# Patient Record
Sex: Female | Born: 1955 | Race: White | Hispanic: Yes | Marital: Single | Smoking: Current every day smoker
Health system: Southern US, Community
[De-identification: ages and names within clinical notes are randomized; demographics above are authoritative.]

## PROBLEM LIST (undated history)

## (undated) DIAGNOSIS — D573 Sickle-cell trait: Secondary | ICD-10-CM

## (undated) DIAGNOSIS — I10 Essential (primary) hypertension: Secondary | ICD-10-CM

## (undated) DIAGNOSIS — M1990 Unspecified osteoarthritis, unspecified site: Secondary | ICD-10-CM

## (undated) DIAGNOSIS — J449 Chronic obstructive pulmonary disease, unspecified: Secondary | ICD-10-CM

## (undated) DIAGNOSIS — F419 Anxiety disorder, unspecified: Secondary | ICD-10-CM

## (undated) DIAGNOSIS — M549 Dorsalgia, unspecified: Secondary | ICD-10-CM

## (undated) DIAGNOSIS — M810 Age-related osteoporosis without current pathological fracture: Secondary | ICD-10-CM

## (undated) DIAGNOSIS — R0600 Dyspnea, unspecified: Secondary | ICD-10-CM

## (undated) DIAGNOSIS — M797 Fibromyalgia: Secondary | ICD-10-CM

## (undated) DIAGNOSIS — K219 Gastro-esophageal reflux disease without esophagitis: Secondary | ICD-10-CM

## (undated) DIAGNOSIS — R002 Palpitations: Secondary | ICD-10-CM

## (undated) DIAGNOSIS — I499 Cardiac arrhythmia, unspecified: Secondary | ICD-10-CM

## (undated) DIAGNOSIS — K5790 Diverticulosis of intestine, part unspecified, without perforation or abscess without bleeding: Secondary | ICD-10-CM

## (undated) DIAGNOSIS — R7303 Prediabetes: Secondary | ICD-10-CM

## (undated) HISTORY — PX: EYE SURGERY: SHX253

---

## 2001-09-03 ENCOUNTER — Other Ambulatory Visit: Admission: RE | Admit: 2001-09-03 | Discharge: 2001-09-03 | Payer: Self-pay | Admitting: Obstetrics and Gynecology

## 2002-10-14 ENCOUNTER — Other Ambulatory Visit: Admission: RE | Admit: 2002-10-14 | Discharge: 2002-10-14 | Payer: Self-pay | Admitting: Obstetrics and Gynecology

## 2005-06-22 ENCOUNTER — Ambulatory Visit: Payer: Self-pay | Admitting: Occupational Therapy

## 2009-11-27 HISTORY — PX: ROTATOR CUFF REPAIR: SHX139

## 2015-06-01 ENCOUNTER — Other Ambulatory Visit (HOSPITAL_COMMUNITY): Payer: Self-pay | Admitting: Family Medicine

## 2015-06-01 DIAGNOSIS — Z1231 Encounter for screening mammogram for malignant neoplasm of breast: Secondary | ICD-10-CM

## 2015-06-01 DIAGNOSIS — M810 Age-related osteoporosis without current pathological fracture: Secondary | ICD-10-CM

## 2015-06-14 ENCOUNTER — Ambulatory Visit (HOSPITAL_COMMUNITY)
Admission: RE | Admit: 2015-06-14 | Discharge: 2015-06-14 | Disposition: A | Discharge status: Home / Self Care | Payer: Medicaid Other | Source: Ambulatory Visit | Attending: Family Medicine | Admitting: Family Medicine

## 2015-06-14 DIAGNOSIS — M818 Other osteoporosis without current pathological fracture: Secondary | ICD-10-CM | POA: Insufficient documentation

## 2015-06-14 DIAGNOSIS — Z1231 Encounter for screening mammogram for malignant neoplasm of breast: Secondary | ICD-10-CM | POA: Diagnosis not present

## 2015-06-14 DIAGNOSIS — M810 Age-related osteoporosis without current pathological fracture: Secondary | ICD-10-CM

## 2015-06-18 ENCOUNTER — Other Ambulatory Visit: Payer: Self-pay | Admitting: Family Medicine

## 2015-06-18 DIAGNOSIS — R928 Other abnormal and inconclusive findings on diagnostic imaging of breast: Secondary | ICD-10-CM

## 2015-07-13 ENCOUNTER — Other Ambulatory Visit (HOSPITAL_COMMUNITY): Payer: Self-pay | Admitting: Family Medicine

## 2015-07-13 DIAGNOSIS — R928 Other abnormal and inconclusive findings on diagnostic imaging of breast: Secondary | ICD-10-CM

## 2015-07-13 DIAGNOSIS — N6320 Unspecified lump in the left breast, unspecified quadrant: Secondary | ICD-10-CM

## 2015-07-27 ENCOUNTER — Other Ambulatory Visit (HOSPITAL_COMMUNITY): Payer: Self-pay | Admitting: Family Medicine

## 2015-07-27 ENCOUNTER — Ambulatory Visit (HOSPITAL_COMMUNITY)
Admission: RE | Admit: 2015-07-27 | Discharge: 2015-07-27 | Disposition: A | Discharge status: Home / Self Care | Payer: Medicaid Other | Source: Ambulatory Visit | Attending: Family Medicine | Admitting: Family Medicine

## 2015-07-27 DIAGNOSIS — N6002 Solitary cyst of left breast: Secondary | ICD-10-CM | POA: Diagnosis not present

## 2015-07-27 DIAGNOSIS — N6320 Unspecified lump in the left breast, unspecified quadrant: Secondary | ICD-10-CM

## 2015-07-27 DIAGNOSIS — R928 Other abnormal and inconclusive findings on diagnostic imaging of breast: Secondary | ICD-10-CM

## 2018-01-21 ENCOUNTER — Other Ambulatory Visit: Payer: Self-pay | Admitting: Sports Medicine

## 2018-01-21 DIAGNOSIS — M47812 Spondylosis without myelopathy or radiculopathy, cervical region: Secondary | ICD-10-CM

## 2018-01-21 DIAGNOSIS — M5136 Other intervertebral disc degeneration, lumbar region: Secondary | ICD-10-CM

## 2018-01-30 ENCOUNTER — Ambulatory Visit
Admission: RE | Admit: 2018-01-30 | Discharge: 2018-01-30 | Disposition: A | Discharge status: Home / Self Care | Payer: Medicaid Other | Source: Ambulatory Visit | Attending: Sports Medicine | Admitting: Sports Medicine

## 2018-01-30 DIAGNOSIS — M48061 Spinal stenosis, lumbar region without neurogenic claudication: Secondary | ICD-10-CM | POA: Diagnosis not present

## 2018-01-30 DIAGNOSIS — M4802 Spinal stenosis, cervical region: Secondary | ICD-10-CM | POA: Insufficient documentation

## 2018-01-30 DIAGNOSIS — M47812 Spondylosis without myelopathy or radiculopathy, cervical region: Secondary | ICD-10-CM | POA: Insufficient documentation

## 2018-01-30 DIAGNOSIS — M5136 Other intervertebral disc degeneration, lumbar region: Secondary | ICD-10-CM | POA: Diagnosis present

## 2018-02-04 ENCOUNTER — Other Ambulatory Visit: Payer: Self-pay | Admitting: Gastroenterology

## 2018-02-04 DIAGNOSIS — R1310 Dysphagia, unspecified: Secondary | ICD-10-CM

## 2018-02-04 DIAGNOSIS — R1011 Right upper quadrant pain: Secondary | ICD-10-CM

## 2018-02-11 ENCOUNTER — Ambulatory Visit
Admission: RE | Admit: 2018-02-11 | Discharge: 2018-02-11 | Disposition: A | Discharge status: Home / Self Care | Payer: Medicaid Other | Source: Ambulatory Visit | Attending: Gastroenterology | Admitting: Gastroenterology

## 2018-02-11 ENCOUNTER — Other Ambulatory Visit: Payer: Medicaid Other

## 2018-02-11 DIAGNOSIS — R1011 Right upper quadrant pain: Secondary | ICD-10-CM

## 2018-02-11 DIAGNOSIS — R1310 Dysphagia, unspecified: Secondary | ICD-10-CM

## 2018-02-11 DIAGNOSIS — K449 Diaphragmatic hernia without obstruction or gangrene: Secondary | ICD-10-CM | POA: Diagnosis not present

## 2018-04-05 ENCOUNTER — Encounter: Payer: Self-pay | Admitting: *Deleted

## 2018-04-08 ENCOUNTER — Encounter: Payer: Self-pay | Admitting: Anesthesiology

## 2018-04-08 ENCOUNTER — Encounter
Admission: RE | Disposition: A | Discharge status: Home / Self Care | Payer: Self-pay | Source: Ambulatory Visit | Attending: Gastroenterology

## 2018-04-08 ENCOUNTER — Ambulatory Visit: Payer: Medicaid Other | Admitting: Certified Registered Nurse Anesthetist

## 2018-04-08 ENCOUNTER — Ambulatory Visit
Admission: RE | Admit: 2018-04-08 | Discharge: 2018-04-08 | Disposition: A | Discharge status: Home / Self Care | Payer: Medicaid Other | Source: Ambulatory Visit | Attending: Gastroenterology | Admitting: Gastroenterology

## 2018-04-08 DIAGNOSIS — Z87891 Personal history of nicotine dependence: Secondary | ICD-10-CM | POA: Diagnosis not present

## 2018-04-08 DIAGNOSIS — D125 Benign neoplasm of sigmoid colon: Secondary | ICD-10-CM | POA: Insufficient documentation

## 2018-04-08 DIAGNOSIS — K5730 Diverticulosis of large intestine without perforation or abscess without bleeding: Secondary | ICD-10-CM | POA: Insufficient documentation

## 2018-04-08 DIAGNOSIS — K635 Polyp of colon: Secondary | ICD-10-CM | POA: Insufficient documentation

## 2018-04-08 DIAGNOSIS — I10 Essential (primary) hypertension: Secondary | ICD-10-CM | POA: Diagnosis not present

## 2018-04-08 DIAGNOSIS — R1310 Dysphagia, unspecified: Secondary | ICD-10-CM | POA: Diagnosis present

## 2018-04-08 DIAGNOSIS — K210 Gastro-esophageal reflux disease with esophagitis: Secondary | ICD-10-CM | POA: Diagnosis not present

## 2018-04-08 DIAGNOSIS — Q399 Congenital malformation of esophagus, unspecified: Secondary | ICD-10-CM | POA: Insufficient documentation

## 2018-04-08 DIAGNOSIS — M1990 Unspecified osteoarthritis, unspecified site: Secondary | ICD-10-CM | POA: Diagnosis not present

## 2018-04-08 DIAGNOSIS — Z8619 Personal history of other infectious and parasitic diseases: Secondary | ICD-10-CM | POA: Insufficient documentation

## 2018-04-08 DIAGNOSIS — R194 Change in bowel habit: Secondary | ICD-10-CM | POA: Insufficient documentation

## 2018-04-08 DIAGNOSIS — D573 Sickle-cell trait: Secondary | ICD-10-CM | POA: Insufficient documentation

## 2018-04-08 DIAGNOSIS — Z79899 Other long term (current) drug therapy: Secondary | ICD-10-CM | POA: Insufficient documentation

## 2018-04-08 DIAGNOSIS — K2950 Unspecified chronic gastritis without bleeding: Secondary | ICD-10-CM | POA: Diagnosis not present

## 2018-04-08 DIAGNOSIS — M810 Age-related osteoporosis without current pathological fracture: Secondary | ICD-10-CM | POA: Insufficient documentation

## 2018-04-08 DIAGNOSIS — K449 Diaphragmatic hernia without obstruction or gangrene: Secondary | ICD-10-CM | POA: Insufficient documentation

## 2018-04-08 DIAGNOSIS — M797 Fibromyalgia: Secondary | ICD-10-CM | POA: Insufficient documentation

## 2018-04-08 HISTORY — DX: Essential (primary) hypertension: I10

## 2018-04-08 HISTORY — PX: ESOPHAGOGASTRODUODENOSCOPY (EGD) WITH PROPOFOL: SHX5813

## 2018-04-08 HISTORY — DX: Age-related osteoporosis without current pathological fracture: M81.0

## 2018-04-08 HISTORY — PX: COLONOSCOPY WITH PROPOFOL: SHX5780

## 2018-04-08 HISTORY — DX: Dorsalgia, unspecified: M54.9

## 2018-04-08 HISTORY — DX: Cardiac arrhythmia, unspecified: I49.9

## 2018-04-08 HISTORY — DX: Fibromyalgia: M79.7

## 2018-04-08 HISTORY — DX: Gastro-esophageal reflux disease without esophagitis: K21.9

## 2018-04-08 HISTORY — DX: Sickle-cell trait: D57.3

## 2018-04-08 HISTORY — DX: Unspecified osteoarthritis, unspecified site: M19.90

## 2018-04-08 SURGERY — COLONOSCOPY WITH PROPOFOL
Anesthesia: General

## 2018-04-08 MED ORDER — SODIUM CHLORIDE 0.9 % IV SOLN
INTRAVENOUS | Status: DC
  Administered 2018-04-08: 1000 mL via INTRAVENOUS

## 2018-04-08 MED ORDER — SODIUM CHLORIDE 0.9 % IV SOLN: INTRAVENOUS | Status: DC

## 2018-04-08 MED ORDER — PROPOFOL 500 MG/50ML IV EMUL
INTRAVENOUS | Status: DC | PRN
  Administered 2018-04-08: 150 ug/kg/min via INTRAVENOUS

## 2018-04-08 MED ORDER — LIDOCAINE HCL (CARDIAC) PF 100 MG/5ML IV SOSY
PREFILLED_SYRINGE | INTRAVENOUS | Status: DC | PRN
  Administered 2018-04-08: 100 mg via INTRAVENOUS

## 2018-04-08 MED ORDER — PROPOFOL 10 MG/ML IV BOLUS
INTRAVENOUS | Status: AC
  Filled 2018-04-08: qty 20

## 2018-04-08 MED ORDER — PROPOFOL 500 MG/50ML IV EMUL
INTRAVENOUS | Status: AC
  Filled 2018-04-08: qty 50

## 2018-04-08 MED ORDER — PROPOFOL 10 MG/ML IV BOLUS
INTRAVENOUS | Status: DC | PRN
  Administered 2018-04-08: 60 mg via INTRAVENOUS
  Administered 2018-04-08: 40 mg via INTRAVENOUS

## 2018-04-08 NOTE — Anesthesia Procedure Notes (Signed)
Performed by: Demetrius Charity, CRNA Pre-anesthesia Checklist: Patient identified, Emergency Drugs available, Suction available, Patient being monitored and Timeout performed Patient Re-evaluated:Patient Re-evaluated prior to induction Oxygen Delivery Method: Nasal cannula

## 2018-04-08 NOTE — Anesthesia Preprocedure Evaluation (Signed)
Anesthesia Evaluation  Patient identified by MRN, date of birth, ID band Patient awake    Reviewed: Allergy & Precautions, H&P , NPO status , Patient's Chart, lab work & pertinent test results, reviewed documented beta blocker date and time   History of Anesthesia Complications Negative for: history of anesthetic complications  Airway Mallampati: III  TM Distance: >3 FB Neck ROM: full    Dental  (+) Dental Advidsory Given, Poor Dentition   Pulmonary neg shortness of breath, neg sleep apnea, COPD, neg recent URI, former smoker,           Cardiovascular Exercise Tolerance: Good hypertension, (-) angina(-) CAD, (-) Past MI, (-) Cardiac Stents and (-) CABG + dysrhythmias (-) Valvular Problems/Murmurs     Neuro/Psych neg Seizures  Neuromuscular disease (fibromyalgia) negative psych ROS   GI/Hepatic Neg liver ROS, GERD  ,  Endo/Other  negative endocrine ROS  Renal/GU negative Renal ROS  negative genitourinary   Musculoskeletal   Abdominal   Peds  Hematology negative hematology ROS (+) Blood dyscrasia, Sickle cell trait ,   Anesthesia Other Findings Past Medical History: No date: Arthritis No date: Back pain No date: Dysrhythmia No date: Fibromyalgia No date: GERD (gastroesophageal reflux disease) No date: Hypertension No date: Osteoporosis No date: Sickle cell trait (HCC)   Reproductive/Obstetrics negative OB ROS                             Anesthesia Physical Anesthesia Plan  ASA: III  Anesthesia Plan: General   Post-op Pain Management:    Induction: Intravenous  PONV Risk Score and Plan: 3 and Propofol infusion  Airway Management Planned: Nasal Cannula  Additional Equipment:   Intra-op Plan:   Post-operative Plan:   Informed Consent: I have reviewed the patients History and Physical, chart, labs and discussed the procedure including the risks, benefits and alternatives for  the proposed anesthesia with the patient or authorized representative who has indicated his/her understanding and acceptance.   Dental Advisory Given  Plan Discussed with: Anesthesiologist, CRNA and Surgeon  Anesthesia Plan Comments:         Anesthesia Quick Evaluation

## 2018-04-08 NOTE — Op Note (Addendum)
[facility] Gastroenterology Patient Name: Colleen Murillo Procedure Date: 04/08/2018 9:39 AM MRN: [id number] Account #: [id number] Date of Birth: 10-10-1956 Admit Type: Outpatient Age: 63 Room: [facility] ENDO ROOM 1 Gender: Female Note Status: Finalized Procedure:            Upper GI endoscopy Indications:          Dysphagia Providers:            Lollie Sails, MD Referring MD:         Audria Nine, MD (Referring MD) Medicines:            Monitored Anesthesia Care Complications:        No immediate complications. Procedure:            Pre-Anesthesia Assessment:                       - ASA Grade Assessment: III - A patient with severe                        systemic disease.                       After obtaining informed consent, the endoscope was                        passed under direct vision. Throughout the procedure,                        the patient's blood pressure, pulse, and oxygen                        saturations were monitored continuously. The Endoscope                        was introduced through the mouth, and advanced to the                        third part of duodenum. The upper GI endoscopy was                        accomplished without difficulty. The patient tolerated                        the procedure well. Findings:      The lower third of the esophagus was mildly tortuous.      A small hiatal hernia was present.      LA Grade A (one or more mucosal breaks less than 5 mm, not extending       between tops of 2 mucosal folds) esophagitis with no bleeding was found.       Biopsies were taken with a cold forceps for histology.      The exam of the esophagus was otherwise normal.      There is no evidence of stenosis or stricture.      Patchy minimal inflammation characterized by erosions and erythema was       found in the prepyloric region of the stomach. Biopsies were taken with       a cold forceps for histology. Biopsies were  taken with a cold forceps       for Helicobacter pylori testing.      The  cardia and gastric fundus were normal on retroflexion.      The examined duodenum was normal. Impression:           - Tortuous esophagus.                       - Small hiatal hernia.                       - LA Grade A erosive esophagitis. Biopsied.                       - Gastritis. Biopsied.                       - Normal examined duodenum. Recommendation:       - Discharge patient to home.                       - Use Protonix (pantoprazole) 40 mg PO daily daily.                       - Perform a hepatobiliary scan with [facility] at appointment                        to be scheduled.                       - Return to GI clinic in 4 weeks. Procedure Code(s):    --- Professional ---                       [phone], Esophagogastroduodenoscopy, flexible, transoral;                        with biopsy, single or multiple Diagnosis Code(s):    --- Professional ---                       Q39.9, Congenital malformation of esophagus, unspecified                       K44.9, Diaphragmatic hernia without obstruction or                        gangrene                       K20.8, Other esophagitis                       K29.70, Gastritis, unspecified, without bleeding                       R13.10, Dysphagia, unspecified CPT copyright 2017 American Medical Association. All rights reserved. The codes documented in this report are preliminary and upon coder review may  be revised to meet current compliance requirements. Lollie Sails, MD 04/08/2018 10:09:04 AM This report has been signed electronically. Number of Addenda: 0 Note Initiated On: 04/08/2018 9:39 AM      [facility]

## 2018-04-08 NOTE — Transfer of Care (Signed)
Immediate Anesthesia Transfer of Care Note  Patient: Colleen Murillo  Procedure(s) Performed: COLONOSCOPY WITH PROPOFOL (N/A ) ESOPHAGOGASTRODUODENOSCOPY (EGD) WITH PROPOFOL (N/A )  Patient Location: PACU  Anesthesia Type:General  Level of Consciousness: sedated  Airway & Oxygen Therapy: Patient Spontanous Breathing and Patient connected to nasal cannula oxygen  Post-op Assessment: Report given to RN and Post -op Vital signs reviewed and stable  Post vital signs: Reviewed and stable  Last Vitals:  Vitals Value Taken Time  BP    Temp    Pulse    Resp    SpO2      Last Pain:  Vitals:   04/08/18 0856  TempSrc: Tympanic  PainSc: 0-No pain         Complications: No apparent anesthesia complications

## 2018-04-08 NOTE — Anesthesia Post-op Follow-up Note (Signed)
Anesthesia QCDR form completed.        

## 2018-04-08 NOTE — Op Note (Addendum)
[facility] Gastroenterology Patient Name: Colleen Murillo Procedure Date: 04/08/2018 9:40 AM MRN: [id number] Account #: [id number] Date of Birth: 06/18/1956 Admit Type: Outpatient Age: 62 Room: [facility] ENDO ROOM 1 Gender: Female Note Status: Finalized Procedure:            Colonoscopy Indications:          Change in bowel habits Providers:            Lollie Sails, MD Referring MD:         Deeann Dowse. Laurance Flatten (Referring MD) Medicines:            Monitored Anesthesia Care Complications:        No immediate complications. Procedure:            Pre-Anesthesia Assessment:                       - ASA Grade Assessment: III - A patient with severe                        systemic disease.                       After obtaining informed consent, the colonoscope was                        passed under direct vision. Throughout the procedure,                        the patient's blood pressure, pulse, and oxygen                        saturations were monitored continuously. The                        Colonoscope was introduced through the anus and                        advanced to the the cecum, identified by appendiceal                        orifice and ileocecal valve. The colonoscopy was                        performed with moderate difficulty. Successful                        completion of the procedure was aided by changing the                        patient to a prone position and using manual pressure.                        The patient tolerated the procedure well. The quality                        of the bowel preparation was good. Findings:      A 5 mm polyp was found in the proximal descending colon. The polyp was       sessile. The polyp was removed with a cold snare. Resection and       retrieval were complete.  A 5 mm polyp was found in the proximal sigmoid colon. The polyp was       sessile. The polyp was removed with a cold snare. Resection and     retrieval were complete.      A 5 mm polyp was found in the distal sigmoid colon. The polyp was       sessile. The polyp was removed with a cold snare. Resection and       retrieval were complete.      Biopsies for histology were taken with a cold forceps from the right       colon and left colon for evaluation of microscopic colitis.      A few small-mouthed diverticula were found in the sigmoid colon and       distal descending colon. Impression:           - One 5 mm polyp in the proximal descending colon,                        removed with a cold snare. Resected and retrieved.                       - One 5 mm polyp in the proximal sigmoid colon, removed                        with a cold snare. Resected and retrieved.                       - One 5 mm polyp in the distal sigmoid colon, removed                        with a cold snare. Resected and retrieved.                       - Diverticulosis in the sigmoid colon and in the distal                        descending colon.                       - Biopsies were taken with a cold forceps from the                        right colon and left colon for evaluation of                        microscopic colitis. Recommendation:       - Discharge patient to home.                       - Use Citrucel one tablespoon PO daily daily. Procedure Code(s):    --- Professional ---                       [phone], Colonoscopy, flexible; with removal of tumor(s),                        polyp(s), or other lesion(s) by snare technique                       16010, 41, Colonoscopy, flexible; with biopsy,  single                        or multiple Diagnosis Code(s):    --- Professional ---                       D12.4, Benign neoplasm of descending colon                       D12.5, Benign neoplasm of sigmoid colon                       R19.4, Change in bowel habit                       K57.30, Diverticulosis of large intestine without                         perforation or abscess without bleeding CPT copyright 2017 American Medical Association. All rights reserved. The codes documented in this report are preliminary and upon coder review may  be revised to meet current compliance requirements. Lollie Sails, MD 04/08/2018 10:48:02 AM This report has been signed electronically. Number of Addenda: 0 Note Initiated On: 04/08/2018 9:40 AM Scope Withdrawal Time: 0 hours 20 minutes 48 seconds  Total Procedure Duration: 0 hours 32 minutes 19 seconds       [facility]

## 2018-04-08 NOTE — H&P (Signed)
Outpatient short stay form Pre-procedure 04/08/2018 9:48 AM Lollie Sails MD  Primary Physician: Alonza Smoker, NP  Reason for visit: EGD and colonoscopy  History of present illness: Patient is a 62 year old female presenting today as above.  She has had a change of bowel habits feeling of incomplete defecation.  She denies any actual diarrhea.  She does have some occasional dysphagia mostly a poor bad taste in her mouth and dryness as well as hoarseness.  Has been treated for Helicobacter pylori in the past.  Not regurgitate foods.  She had a barium swallow done on 02/11/2018 with finding of a small nonreducible hiatal hernia with a possible short segment narrowing above the hiatal hernia that delayed transit of the barium tablet no evidence of esophagitis or other lesion.  She tolerated her prep well.  She takes no aspirin or blood thinning agent.    Current Facility-Administered Medications:  .  0.9 %  sodium chloride infusion, , Intravenous, Continuous, Manya Silvas, MD, Last Rate: 20 mL/hr at 04/08/18 0910, 1,000 mL at 04/08/18 0910 .  0.9 %  sodium chloride infusion, , Intravenous, Continuous, Lollie Sails, MD  Medications Prior to Admission  Medication Sig Dispense Refill Last Dose  . acetaminophen (TYLENOL) 500 MG tablet Take 500 mg by mouth every 6 (six) hours as needed.   Past Week at Unknown time  . amLODipine (NORVASC) 5 MG tablet Take 5 mg by mouth daily.   04/08/2018 at 0600  . calcium carbonate (OSCAL) 1500 (600 Ca) MG TABS tablet Take 600 mg by mouth daily with breakfast.   Past Week at Unknown time  . cholecalciferol (VITAMIN D) 400 units TABS tablet Take 2,000 Units by mouth daily.   Past Week at Unknown time  . DULoxetine (CYMBALTA) 30 MG capsule Take 30 mg by mouth daily.   Past Week at Unknown time  . Melatonin 10 MG TABS Take 10 mg by mouth daily.   Past Week at Unknown time  . metoprolol tartrate (LOPRESSOR) 25 MG tablet Take 25 mg by mouth 2 (two) times  daily.   04/08/2018 at 0600  . pantoprazole (PROTONIX) 40 MG tablet Take 40 mg by mouth 2 (two) times daily.   Past Week at Unknown time  . ranitidine (ZANTAC) 150 MG tablet Take 150 mg by mouth daily.   Past Week at Unknown time  . Tiotropium Bromide Monohydrate 2.5 MCG/ACT AERS Inhale 2.5 mcg into the lungs daily.   Past Week at Unknown time     No Known Allergies   Past Medical History:  Diagnosis Date  . Arthritis   . Back pain   . Dysrhythmia   . Fibromyalgia   . GERD (gastroesophageal reflux disease)   . Hypertension   . Osteoporosis   . Sickle cell trait ([place])     Review of systems:      Physical Exam    Heart and lungs: Regular rate and rhythm without rub or gallop, lungs are bilaterally clear.    HEENT: Normocephalic atraumatic eyes are anicteric    Other:    Pertinant exam for procedure: Soft nontender nondistended bowel sounds positive normoactive    Planned proceedures: EGD and colonoscopy with indicated procedures. I have discussed the risks benefits and complications of procedures to include not limited to bleeding, infection, perforation and the risk of sedation and the patient wishes to proceed.    Lollie Sails, MD Gastroenterology 04/08/2018  9:48 AM

## 2018-04-08 NOTE — Anesthesia Postprocedure Evaluation (Signed)
Anesthesia Post Note  Patient: Kristol Almanzar  Procedure(s) Performed: COLONOSCOPY WITH PROPOFOL (N/A ) ESOPHAGOGASTRODUODENOSCOPY (EGD) WITH PROPOFOL (N/A )  Patient location during evaluation: Endoscopy Anesthesia Type: General Level of consciousness: awake and alert Pain management: pain level controlled Vital Signs Assessment: post-procedure vital signs reviewed and stable Respiratory status: spontaneous breathing, nonlabored ventilation, respiratory function stable and patient connected to nasal cannula oxygen Cardiovascular status: blood pressure returned to baseline and stable Postop Assessment: no apparent nausea or vomiting Anesthetic complications: no     Last Vitals:  Vitals:   04/08/18 1050 04/08/18 1120  BP: 125/78 135/88  Pulse: 74 66  Resp: 15 17  Temp:    SpO2: 100% 100%    Last Pain:  Vitals:   04/08/18 1040  TempSrc: Tympanic  PainSc:                  Martha Clan

## 2018-04-10 ENCOUNTER — Encounter: Payer: Self-pay | Admitting: Gastroenterology

## 2018-04-10 LAB — SURGICAL PATHOLOGY

## 2018-04-29 ENCOUNTER — Encounter: Payer: Self-pay | Admitting: Gastroenterology

## 2018-05-07 ENCOUNTER — Other Ambulatory Visit: Payer: Self-pay | Admitting: Gastroenterology

## 2018-05-07 DIAGNOSIS — R140 Abdominal distension (gaseous): Secondary | ICD-10-CM

## 2018-05-07 DIAGNOSIS — K219 Gastro-esophageal reflux disease without esophagitis: Secondary | ICD-10-CM

## 2018-07-04 ENCOUNTER — Ambulatory Visit
Admission: RE | Admit: 2018-07-04 | Discharge: 2018-07-04 | Disposition: A | Discharge status: Home / Self Care | Payer: Medicaid Other | Source: Ambulatory Visit | Attending: Gastroenterology | Admitting: Gastroenterology

## 2018-07-04 DIAGNOSIS — A048 Other specified bacterial intestinal infections: Secondary | ICD-10-CM | POA: Insufficient documentation

## 2018-07-04 DIAGNOSIS — R1013 Epigastric pain: Secondary | ICD-10-CM | POA: Diagnosis present

## 2018-07-04 DIAGNOSIS — R140 Abdominal distension (gaseous): Secondary | ICD-10-CM | POA: Insufficient documentation

## 2018-07-04 DIAGNOSIS — K219 Gastro-esophageal reflux disease without esophagitis: Secondary | ICD-10-CM | POA: Insufficient documentation

## 2018-07-04 MED ORDER — TECHNETIUM TC 99M MEBROFENIN IV KIT
5.0000 | PACK | Freq: Once | INTRAVENOUS | Status: AC | PRN
  Administered 2018-07-04: 4.87 via INTRAVENOUS

## 2018-07-18 ENCOUNTER — Other Ambulatory Visit: Payer: Self-pay | Admitting: Gastroenterology

## 2018-07-18 DIAGNOSIS — R140 Abdominal distension (gaseous): Secondary | ICD-10-CM

## 2018-08-07 ENCOUNTER — Ambulatory Visit
Admission: RE | Admit: 2018-08-07 | Discharge: 2018-08-07 | Disposition: A | Discharge status: Home / Self Care | Payer: Medicaid Other | Source: Ambulatory Visit | Attending: Gastroenterology | Admitting: Gastroenterology

## 2018-08-07 DIAGNOSIS — K5730 Diverticulosis of large intestine without perforation or abscess without bleeding: Secondary | ICD-10-CM | POA: Insufficient documentation

## 2018-08-07 DIAGNOSIS — I700 Atherosclerosis of aorta: Secondary | ICD-10-CM | POA: Insufficient documentation

## 2018-08-07 DIAGNOSIS — N200 Calculus of kidney: Secondary | ICD-10-CM | POA: Diagnosis not present

## 2018-08-07 DIAGNOSIS — I2510 Atherosclerotic heart disease of native coronary artery without angina pectoris: Secondary | ICD-10-CM | POA: Insufficient documentation

## 2018-08-07 DIAGNOSIS — R140 Abdominal distension (gaseous): Secondary | ICD-10-CM | POA: Insufficient documentation

## 2018-08-07 LAB — POCT I-STAT CREATININE: Creatinine, Ser: 0.9 mg/dL (ref 0.44–1.00)

## 2018-08-07 MED ORDER — IOPAMIDOL (ISOVUE-300) INJECTION 61%
85.0000 mL | Freq: Once | INTRAVENOUS | Status: AC | PRN
  Administered 2018-08-07: 85 mL via INTRAVENOUS

## 2020-03-06 IMAGING — NM NM HEPATO W/GB/PHARM/[PERSON_NAME]
2 series · 12 of 12 positions shown · non-contrast
Comparison: None.

CLINICAL DATA: Upper abdominal pain

EXAM:
NUCLEAR MEDICINE HEPATOBILIARY IMAGING WITH GALLBLADDER EF
VIEWS:
Anterior right upper quadrant
RADIOPHARMACEUTICALS:  4.87 mCi 2c-77m  Choletec IV

[Series 1000: hepatobiliary scan · 9.59mm/px · 6 of 60 frames shown]
[frame 6/60]
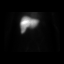
[frame 16/60]
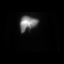
[frame 26/60]
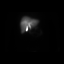
[frame 36/60]
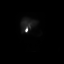
[frame 46/60]
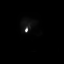
[frame 56/60]
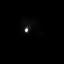

[Series 1000: gallbladder ef · 4.80mm/px · 6 of 120 frames shown]
[frame 11/120]
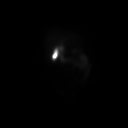
[frame 31/120]
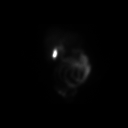
[frame 51/120]
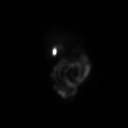
[frame 71/120]
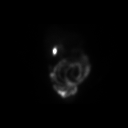
[frame 91/120]
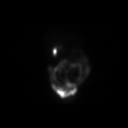
[frame 111/120]
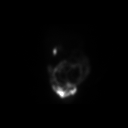

[12 of 12 positions shown; findings below may reference images not displayed]

FINDINGS: Liver uptake of radiotracer is normal. There is prompt visualization
of gallbladder and small bowel, indicating patency of the cystic and
common bile ducts. The patient consumed 8 ounces of Ensure orally
with calculation of the computer generated ejection fraction of
radiotracer from the gallbladder. The patient did not experience
clinical symptoms with the oral Ensure consumption. The computer
generated ejection fraction of radiotracer from the gallbladder is
normal at 93%, normal greater than 33% using the oral agent.
IMPRESSION: Study within normal limits.

## 2023-03-20 ENCOUNTER — Other Ambulatory Visit: Payer: Self-pay

## 2023-03-20 DIAGNOSIS — F17210 Nicotine dependence, cigarettes, uncomplicated: Secondary | ICD-10-CM

## 2023-03-20 DIAGNOSIS — Z87891 Personal history of nicotine dependence: Secondary | ICD-10-CM

## 2023-03-28 ENCOUNTER — Encounter: Payer: Self-pay | Admitting: Physician Assistant

## 2023-03-28 ENCOUNTER — Ambulatory Visit (INDEPENDENT_AMBULATORY_CARE_PROVIDER_SITE_OTHER): Payer: 59 | Admitting: Physician Assistant

## 2023-03-28 DIAGNOSIS — F17210 Nicotine dependence, cigarettes, uncomplicated: Secondary | ICD-10-CM

## 2023-03-28 NOTE — Patient Instructions (Signed)
Thank you for participating in the [facility] Scre[facility] Program. It was our pleasure to meet you today. We will call you with the results of your scan within the next few days. Your scan will be assigned a Lung RADS category score by the physicians reading the scans.  This Lung RADS score determines follow up scanning.  See below for description of categories, and follow up screening recommendations. We will be in touch to schedule your follow up screening annually or based on recommendations of our providers. We will fax a copy of your scan results to your Primary Care Physician, or the physician who referred you to the program, to ensure they have the results. Please call the office if you have any questions or concerns regarding your scanning experience or results.  Our office number is [phone]. Please speak with Denise Phelps, RN. , or  Denise Buckner RN, They are  our Lung Cancer Screening RN.'s If They are unavailable when you call, Please leave a message on the voice mail. We will return your call at our earliest convenience.This voice mail is monitored several times a day.  Remember, if your scan is normal, we will scan you annually as long as you continue to meet the criteria for the program. (Age 50-80, Current smoker or smoker who has quit within the last 15 years). If you are a smoker, remember, quitting is the single most powerful action that you can take to decrease your risk of lung cancer and other pulmonary, breathing related problems. We know quitting is hard, and we are here to help.  Please let us know if there is anything we can do to help you meet your goal of quitting. If you are a former smoker, congratulations. We are proud of you! Remain smoke free! Remember you can refer friends or family members through the number above.  We will screen them to make sure they meet criteria for the program. Thank you for helping us take better care of you by  participating in Lung Screening.  You can receive free nicotine replacement therapy ( patches, gum or mints) by calling 1-800-QUIT NOW. Please call so we can get you on the path to becoming  a non-smoker. I know it is hard, but you can do this!  Lung RADS Categories:  Lung RADS 1: no nodules or definitely non-concerning nodules.  Recommendation is for a repeat annual scan in 12 months.  Lung RADS 2:  nodules that are non-concerning in appearance and behavior with a very low likelihood of becoming an active cancer. Recommendation is for a repeat annual scan in 12 months.  Lung RADS 3: nodules that are probably non-concerning , includes nodules with a low likelihood of becoming an active cancer.  Recommendation is for a 6-month repeat screening scan. Often noted after an upper respiratory illness. We will be in touch to make sure you have no questions, and to schedule your 6-month scan.  Lung RADS 4 A: nodules with concerning findings, recommendation is most often for a follow up scan in 3 months or additional testing based on our provider's assessment of the scan. We will be in touch to make sure you have no questions and to schedule the recommended 3 month follow up scan.  Lung RADS 4 B:  indicates findings that are concerning. We will be in touch with you to schedule additional diagnostic testing based on our provider's  assessment of the scan.  Other options for assistance in smoking cessation (   As covered by your insurance benefits)  Hypnosis for smoking cessation  [employer] [phone]  Acupuncture for smoking cessation  [facility] [phone]   

## 2023-03-28 NOTE — Progress Notes (Signed)
Virtual Visit via Telephone Note  I connected with Ronnette Juniper on 03/28/23 at 11:00 AM EDT by telephone and verified that I am speaking with the correct person using two identifiers.  Location: Patient: home Provider: working virtually from home   I discussed the limitations, risks, security and privacy concerns of performing an evaluation and management service by telephone and the availability of in person appointments. I also discussed with the patient that there may be a patient responsible charge related to this service. The patient expressed understanding and agreed to proceed.       Shared Decision Making Visit Lung Cancer Screening Program [phone])   Eligibility: Age 67 y.o. Pack Years Smoking History Calculation 43 (# packs/per year x # years smoked) Recent History of coughing up blood  no Unexplained weight loss? no ( >Than 15 pounds within the last 6 months ) Prior History Lung / other cancer no (Diagnosis within the last 5 years already requiring surveillance chest CT Scans). Smoking Status Current Smoker   Visit Components: Discussion included one or more decision making aids. yes Discussion included risk/benefits of screening. yes Discussion included potential follow up diagnostic testing for abnormal scans. yes Discussion included meaning and risk of over diagnosis. yes Discussion included meaning and risk of False Positives. yes Discussion included meaning of total radiation exposure. yes  Counseling Included: Importance of adherence to annual lung cancer LDCT screening. yes Impact of comorbidities on ability to participate in the program. yes Ability and willingness to under diagnostic treatment. yes  Smoking Cessation Counseling: Current Smokers:  Discussed importance of smoking cessation. yes Information about tobacco cessation classes and interventions provided to patient. yes Patient provided with "ticket" for LDCT Scan. N/a Symptomatic  Patient. no Diagnosis Code: Tobacco Use Z72.0 Asymptomatic Patient yes  Counseling (Intermediate counseling: > three minutes counseling) U0454 Diagnosis Code: Personal History of Nicotine Dependence. U98.119 Information about tobacco cessation classes and interventions provided to patient. Yes Written Order for Lung Cancer Screening with LDCT placed in Epic. Yes (CT Chest Lung Cancer Screening Low Dose W/O CM) [id number] Z12.2-Screening of respiratory organs Z87.891-Personal history of nicotine dependence  I have spent 25 minutes of face to face/ virtual visit   time with the patient discussing the risks and benefits of lung cancer screening. We viewed / discussed a power point together that explained in detail the above noted topics. We paused at intervals to allow for questions to be asked and answered to ensure understanding.We discussed that the single most powerful action that she can take to decrease her risk of developing lung cancer is to quit smoking. We discussed whether or not she is ready to commit to setting a quit date. We discussed options for tools to aid in quitting smoking including nicotine replacement therapy, non-nicotine medications, support groups, Quit Smart classes, and behavior modification. We discussed that often times setting smaller, more achievable goals, such as eliminating 1 cigarette a day for a week and then 2 cigarettes a day for a week can be helpful in slowly decreasing the number of cigarettes smoked. This allows for a sense of accomplishment as well as providing a clinical benefit. I provided  her  with smoking cessation  information  with contact information for community resources, classes, free nicotine replacement therapy, and access to mobile apps, text messaging, and on-line smoking cessation help. I have also provided  She has  the office contact information in the event she needs to contact me, or the screening staff. We discussed the  time and location of the  scan, and that either Abigail Miyamoto RN, Karlton Lemon, RN  or I will call / send a letter with the results within 24-72 hours of receiving them. The patient verbalized understanding of all of  the above and had no further questions upon leaving the office. They have my contact information in the event they have any further questions.  I spent three minutes counseling on smoking cessation and the health risks of continued tobacco abuse.  I explained to the patient that there has been a high incidence of coronary artery disease noted on these exams. I explained that this is a non-gated exam therefore degree or severity cannot be determined. This patient is not on statin therapy. I have asked the patient to follow-up with their PCP regarding any incidental finding of coronary artery disease and management with diet or medication as their PCP  feels is clinically indicated. The patient verbalized understanding of the above and had no further questions upon completion of the visit.      Darcella Gasman Marcey Persad, PA-C

## 2023-04-03 ENCOUNTER — Ambulatory Visit
Admission: RE | Admit: 2023-04-03 | Discharge: 2023-04-03 | Disposition: A | Discharge status: Home / Self Care | Payer: 59 | Source: Ambulatory Visit | Attending: Acute Care | Admitting: Acute Care

## 2023-04-03 DIAGNOSIS — J439 Emphysema, unspecified: Secondary | ICD-10-CM | POA: Insufficient documentation

## 2023-04-03 DIAGNOSIS — I2510 Atherosclerotic heart disease of native coronary artery without angina pectoris: Secondary | ICD-10-CM | POA: Insufficient documentation

## 2023-04-03 DIAGNOSIS — Z122 Encounter for screening for malignant neoplasm of respiratory organs: Secondary | ICD-10-CM | POA: Diagnosis not present

## 2023-04-03 DIAGNOSIS — F17210 Nicotine dependence, cigarettes, uncomplicated: Secondary | ICD-10-CM | POA: Diagnosis present

## 2023-04-03 DIAGNOSIS — I700 Atherosclerosis of aorta: Secondary | ICD-10-CM | POA: Insufficient documentation

## 2023-04-03 DIAGNOSIS — Z87891 Personal history of nicotine dependence: Secondary | ICD-10-CM

## 2023-04-06 ENCOUNTER — Other Ambulatory Visit: Payer: Self-pay | Admitting: Acute Care

## 2023-04-06 DIAGNOSIS — Z87891 Personal history of nicotine dependence: Secondary | ICD-10-CM

## 2023-04-06 DIAGNOSIS — F17210 Nicotine dependence, cigarettes, uncomplicated: Secondary | ICD-10-CM

## 2023-04-06 DIAGNOSIS — Z122 Encounter for screening for malignant neoplasm of respiratory organs: Secondary | ICD-10-CM

## 2023-04-10 NOTE — H&P (Signed)
Colleen Murillo is a 67 y.o. female here for [facility] / BSO and anterior colporrhaphy  .   Pt here consultation from NP for pelvic prolapse . C/o 2 yrs of pressure and tissue falling . G3P3 svd x 3  + nocturia Not sexually active  Also with vulvar irritation and some itching  Past Medical History:  has a past medical history of Arthritis, Back pain, Colon polyp, COPD (chronic obstructive pulmonary disease) (CMS/HHS-HCC), Diverticulosis, Fibromyalgia, GERD (gastroesophageal reflux disease), Hypertension, Irregular heartbeat, Neck pain, Osteoporosis, and Sickle cell trait (CMS-HCC).  Past Surgical History:  has a past surgical history that includes Arthroscopic Rotator Cuff Repair (Right, 2011); egd (04/08/2018); and Colonoscopy (04/08/2018). Family History: family history includes Diabetes type II in her father and paternal grandmother; Heart disease in her father; High blood pressure (Hypertension) in her father; Hodgkin's lymphoma in her daughter; Kidney failure in her father; No Known Problems in her brother, sister, and sister; Prostate cancer in her father; Sickle cell trait in her daughter, maternal aunt, and son; Uterine cancer in her mother. Social History:  reports that she has been smoking cigarettes. She has never used smokeless tobacco. She reports that she does not drink alcohol and does not use drugs. OB/GYN History:  OB History       Gravida  3   Para  3   Term      Preterm      AB      Living  3        SAB      IAB      Ectopic      Molar      Multiple      Live Births  3             Allergies: is allergic to augmentin [amoxicillin-pot clavulanate]. Medications:  Current Medications    Current Outpatient Medications:    acetaminophen (TYLENOL) 500 mg capsule, Take 1,000 mg by mouth every other day, Disp: , Rfl:    albuterol 90 mcg/actuation inhaler, Inhale 2 inhalations into the lungs every 6 (six) hours as needed for Wheezing, Disp: 1 each, Rfl: 6    amLODIPine (NORVASC) 10 MG tablet, Take 10 mg by mouth once daily, Disp: , Rfl:    calcium carbonate (CALCIUM 600 ORAL), Take 1 tablet by mouth once daily  , Disp: , Rfl:    metoprolol tartrate (LOPRESSOR) 25 MG tablet, Take 25 mg by mouth 2 (two) times daily  , Disp: , Rfl:    methylPREDNISolone (MEDROL) 4 MG tablet, Take 6 tablets on days 1 and 2, 5 tablets on days 3 and 4, 4 tablets on days 5 and 6, etc., until completed. (Patient not taking: Reported on 03/02/2023), Disp: 42 tablet, Rfl: 0     Review of Systems: General:                      No fatigue or weight loss Eyes:                           No vision changes Ears:                            No hearing difficulty Respiratory:                No cough or shortness of breath Pulmonary:  No asthma or shortness of breath Cardiovascular:           No chest pain, palpitations, dyspnea on exertion Gastrointestinal:          No abdominal bloating, chronic diarrhea, constipations, masses, pain or hematochezia Genitourinary:             No hematuria, dysuria, abnormal vaginal discharge, pelvic pain, Menometrorrhagia Lymphatic:                   No swollen lymph nodes Musculoskeletal:No muscle weakness Neurologic:                  No extremity weakness, syncope, seizure disorder Psychiatric:                  No history of depression, delusions or suicidal/homicidal ideation      Exam:       Vitals:  04/11/23   BP: (!) 144/84  Pulse: 84      Body mass index is 23.63 kg/m.   WDWN white/  female in NAD   Lungs: CTA  CV : RRR without murmur     Neck:  no thyromegaly Abdomen: soft , no mass, normal active bowel sounds,  non-tender, no rebound tenderness Pelvic: tanner stage 5 ,  External genitalia: vulva /labia no lesions Urethra: no prolapse Vagina: normal physiologic d/c 3 rd degree cystocele, no rectocele  adequate room for TVH if need be  Cervix: no lesions, no cervical motion tenderness   Uterus: 2-3 degree  descensus normal size shape and contour, non-tender Adnexa: no mass,  non-tender   Rectovaginal:  Impression:    The primary encounter diagnosis was Cystocele with uterine descensus. A diagnosis of Cervical cancer screening was also pertinent to this visit.       Plan:  Offered pessary use vs surgery ie .Marland Kitchen TVH and cystocele repair  She elects for the latter .  Will start with estrace vaginal cream 1/4 applicator 3x/ week until surgery  Triamcinolone 0.1 % 2x/ week  I have illustrated her vaginal defects and explained the procedure . I have counseled regarding the risks of urinary retention post op and need for catheterization   She has COPD and she is aware that chronic cough may lead to breakdown in anterior colporrhaphy    other risks to the procedure see [facility] notes     Vilma Prader, MD

## 2023-04-19 ENCOUNTER — Other Ambulatory Visit: Payer: Self-pay

## 2023-04-19 ENCOUNTER — Encounter
Admission: RE | Admit: 2023-04-19 | Discharge: 2023-04-19 | Disposition: A | Discharge status: Home / Self Care | Payer: 59 | Source: Ambulatory Visit | Attending: Obstetrics and Gynecology | Admitting: Obstetrics and Gynecology

## 2023-04-19 DIAGNOSIS — I10 Essential (primary) hypertension: Secondary | ICD-10-CM

## 2023-04-19 HISTORY — DX: Chronic obstructive pulmonary disease, unspecified: J44.9

## 2023-04-19 HISTORY — DX: Anxiety disorder, unspecified: F41.9

## 2023-04-19 NOTE — Patient Instructions (Addendum)
Your procedure is scheduled on: 04/25/23 - Wednesday Report to the Registration Desk on the 1st floor of the [facility]. To find out your arrival time, please call [phone] between 1PM - 3PM on: 04/24/23 - Tuesday If your arrival time is 6:00 am, do not arrive before that time as the [facility] entrance doors do not open until 6:00 am.  REMEMBER: Instructions that are not followed completely may result in serious medical risk, up to and including death; or upon the discretion of your surgeon and anesthesiologist your surgery may need to be rescheduled.  Do not eat food after midnight the night before surgery.  No gum chewing or hard candies.  You may however, drink CLEAR liquids up to 2 hours before you are scheduled to arrive for your surgery. Do not drink anything within 2 hours of your scheduled arrival time.  Clear liquids include: - water  - apple juice without pulp - gatorade (not RED colors) - black coffee or tea (Do NOT add milk or creamers to the coffee or tea) Do NOT drink anything that is not on this list.  In addition, your doctor has ordered for you to drink the provided:  Ensure Pre-Surgery Clear Carbohydrate Drink  Drinking this carbohydrate drink up to two hours before surgery helps to reduce insulin resistance and improve patient outcomes. Please complete drinking 2 hours before scheduled arrival time.  One week prior to surgery beginning 04/18/23,  Stop Anti-inflammatories (NSAIDS) such as Advil, Aleve, Ibuprofen, Motrin, Naproxen, Naprosyn and Aspirin based products such as Excedrin, Goody's Powder, BC Powder.  Stop taking beginning 04/18/23, ANY OVER THE COUNTER supplements until after surgery.calcium carbonate (OSCAL) , cholecalciferol (VITAMIN D) .  You may however, continue to take Tylenol if needed for pain up until the day of surgery.  TAKE ONLY THESE MEDICATIONS THE MORNING OF SURGERY WITH A SIP OF WATER:   metoprolol tartrate (LOPRESSOR)   budesonide-formoterol (SYMBICORT)   Use inhaler albuterol (VENTOLIN HFA)  on the day of surgery and bring to the hospital.  No Alcohol for 24 hours before or after surgery.  No Smoking including e-cigarettes for 24 hours before surgery.  No chewable tobacco products for at least 6 hours before surgery.  No nicotine patches on the day of surgery.  Do not use any "recreational" drugs for at least a week (preferably 2 weeks) before your surgery.  Please be advised that the combination of cocaine and anesthesia may have negative outcomes, up to and including death. If you test positive for cocaine, your surgery will be cancelled.  On the morning of surgery brush your teeth with toothpaste and water, you may rinse your mouth with mouthwash if you wish. Do not swallow any toothpaste or mouthwash.  Use CHG Soap or wipes as directed on instruction sheet.  Do not wear jewelry, make-up, hairpins, clips or nail polish.  Do not wear lotions, powders, or perfumes.   Do not shave body hair from the neck down 48 hours before surgery.  Contact lenses, hearing aids and dentures may not be worn into surgery.  Do not bring valuables to the hospital. [facility] is not responsible for any missing/lost belongings or valuables.   Notify your doctor if there is any change in your medical condition (cold, fever, infection).  Wear comfortable clothing (specific to your surgery type) to the hospital.  After surgery, you can help prevent lung complications by doing breathing exercises.  Take deep breaths and cough every 1-2 hours. Your doctor  may order a device called an Incentive Spirometer to help you take deep breaths. When coughing or sneezing, hold a pillow firmly against your incision with both hands. This is called "splinting." Doing this helps protect your incision. It also decreases belly discomfort.  If you are being admitted to the hospital overnight, leave your suitcase in the car. After  surgery it may be brought to your room.  In case of increased patient census, it may be necessary for you, the patient, to continue your postoperative care in the Same Day Surgery department.  If you are being discharged the day of surgery, you will not be allowed to drive home. You will need a responsible individual to drive you home and stay with you for 24 hours after surgery.   If you are taking public transportation, you will need to have a responsible individual with you.  Please call the Pre-admissions Testing Dept. at [phone] if you have any questions about these instructions.  Surgery Visitation Policy:  Patients having surgery or a procedure may have two visitors.  Children under the age of 76 must have an adult with them who is not the patient.  Inpatient Visitation:    Visiting hours are 7 a.m. to 8 p.m. Up to four visitors are allowed at one time in a patient room. The visitors may rotate out with other people during the day.  One visitor age 84 or older may stay with the patient overnight and must be in the room by 8 p.m.

## 2023-04-20 ENCOUNTER — Encounter
Admission: RE | Admit: 2023-04-20 | Discharge: 2023-04-20 | Disposition: A | Discharge status: Home / Self Care | Payer: 59 | Source: Ambulatory Visit | Attending: Obstetrics and Gynecology | Admitting: Obstetrics and Gynecology

## 2023-04-20 ENCOUNTER — Telehealth: Payer: Self-pay | Admitting: Urgent Care

## 2023-04-20 DIAGNOSIS — I10 Essential (primary) hypertension: Secondary | ICD-10-CM | POA: Diagnosis not present

## 2023-04-20 DIAGNOSIS — Z01812 Encounter for preprocedural laboratory examination: Secondary | ICD-10-CM

## 2023-04-20 DIAGNOSIS — E876 Hypokalemia: Secondary | ICD-10-CM | POA: Insufficient documentation

## 2023-04-20 DIAGNOSIS — Z01818 Encounter for other preprocedural examination: Secondary | ICD-10-CM | POA: Diagnosis present

## 2023-04-20 LAB — CBC
HCT: 47.7 % — ABNORMAL HIGH (ref 36.0–46.0)
Hemoglobin: 16.4 g/dL — ABNORMAL HIGH (ref 12.0–15.0)
MCH: 27.4 pg (ref 26.0–34.0)
MCHC: 34.4 g/dL (ref 30.0–36.0)
MCV: 79.8 fL — ABNORMAL LOW (ref 80.0–100.0)
Platelets: 400 10*3/uL (ref 150–400)
RBC: 5.98 MIL/uL — ABNORMAL HIGH (ref 3.87–5.11)
RDW: 13.3 % (ref 11.5–15.5)
WBC: 7.6 10*3/uL (ref 4.0–10.5)
nRBC: 0 % (ref 0.0–0.2)

## 2023-04-20 LAB — TYPE AND SCREEN
ABO/RH(D): O POS
Antibody Screen: NEGATIVE

## 2023-04-20 LAB — BASIC METABOLIC PANEL
Anion gap: 9 (ref 5–15)
BUN: 7 mg/dL — ABNORMAL LOW (ref 8–23)
CO2: 26 mmol/L (ref 22–32)
Calcium: 9.5 mg/dL (ref 8.9–10.3)
Chloride: 106 mmol/L (ref 98–111)
Creatinine, Ser: 0.67 mg/dL (ref 0.44–1.00)
GFR, Estimated: >60 mL/min (ref >60)
Glucose, Bld: 96 mg/dL (ref 70–99)
Potassium: 3.1 mmol/L — ABNORMAL LOW (ref 3.5–5.1)
Sodium: 141 mmol/L (ref 135–145)

## 2023-04-20 MED ORDER — POTASSIUM CHLORIDE ER 10 MEQ PO TBCR
EXTENDED_RELEASE_TABLET | ORAL | 0 refills | Status: AC
Start: 2023-04-20 — End: ?

## 2023-04-20 NOTE — Progress Notes (Signed)
   [facility] Perioperative Services: Pre-Admission/Anesthesia Testing  Abnormal Lab Notification and Treatment Plan of Care   Date: 04/20/23  Name: Colleen Murillo MRN:   [id number]  Re: Abnormal labs noted during PAT appointment   Notified:  Provider Name Provider Role Notification Mode  Schermermorn, Maisie Fus, MD OB/GYN (Surgeon) Routed and/or faxed via Adam Phenix, MD Primary Care Provider Routed and/or faxed via [facility]   Clinical Information and Notes:  ABNORMAL LAB VALUE(S): Lab Results  Component Value Date   K 3.1 (L) 04/20/2023   Colleen Murillo is scheduled for an elective HYSTERECTOMY VAGINAL/ BILATERAL SALPINGO OOPHORECTOMY (Bilateral); ANTERIOR (CYSTOCELE) AND POSTERIOR REPAIR (RECTOCELE) on 04/25/2023. In review of her medication reconciliation, it is noted that the patient is not taking prescribed diuretic medications.   Please note, in efforts to promote a safe and effective anesthetic course, per current guidelines/standards set by the [facility] anesthesia team, the minimal acceptable K+ level for the patient to proceed with general anesthesia is 3.0 mmol/L. With that being said, if the patient drops any lower, her elective procedure will need to be postponed until K+ is better optimized. In efforts to prevent case cancellation, will make efforts to optimize pre-surgical K+ level so that patient can safely undergo the planned surgical intervention.   Impression and Plan:  Colleen Murillo found to be HYPOkalemic at 3.1 mmol/L on preoperative labs. She is not on diuretics or potassium supplementation. Called patient to discuss results and plans for correction of noted electrolyte derangement as follows:  Meds ordered this encounter  Medications   potassium chloride (KLOR-CON) 10 MEQ tablet    Sig: Take 2 tablets (20 mEq) today, then 1 tablet (10 mEq) daily until surgery. Take dose on day of surgery. Follow up with PCP for repeat labs.     Dispense:  7 tablet    Refill:  0    Please contact patient once filled and ready for pick up. Rx for preop K+ optimization and needs to be started ASAP.   Encouraged patient to follow up with PCP about 2-3 weeks postoperatively to have labs rechecked to ensure that levels are remaining within normal range. Discussed nutritional intake of K+ rich foods as an adjunctive way to keep her K+ levels normal; list of K+ rich foods verbally provided. Also mentioned ORS, however advised her not to rely solely on these drinks, as they are high in Na+, and she has a HTN diagnosis.   Will send copy of this note to surgeon and PCP to make them aware of K+ level and plans for correction. Discussed that PCP may consider adding a daily K+ supplement if levels remain low on recheck. Order entered to recheck K+ on the day of her surgery to ensure optimization. Wished patient the best of luck with her upcoming surgery and subsequent recovery. She was encouraged to return call to the PAT clinic, or to her surgeon's office, should any questions or concerns arise between now and the time of her surgery.    Encounter Diagnoses  Name Primary?   Pre-operative laboratory examination Yes   Hypokalemia    Quentin Mulling, MSN, APRN, FNP-C, CEN [facility]  Peri-operative Services Nurse Practitioner Phone: [phone] 04/20/23 12:19 PM  NOTE: This note has been prepared using Dragon dictation software. Despite my best ability to proofread, there is always the potential that unintentional transcriptional errors may still occur from this process.

## 2023-04-25 ENCOUNTER — Other Ambulatory Visit: Payer: Self-pay

## 2023-04-25 ENCOUNTER — Ambulatory Visit
Admission: RE | Admit: 2023-04-25 | Discharge: 2023-04-25 | Disposition: A | Discharge status: Home / Self Care | Payer: 59 | Attending: Obstetrics and Gynecology | Admitting: Obstetrics and Gynecology

## 2023-04-25 ENCOUNTER — Encounter: Payer: Self-pay | Admitting: Obstetrics and Gynecology

## 2023-04-25 ENCOUNTER — Encounter
Admission: RE | Disposition: A | Discharge status: Home / Self Care | Payer: Self-pay | Source: Home / Self Care | Attending: Obstetrics and Gynecology

## 2023-04-25 ENCOUNTER — Ambulatory Visit: Payer: 59 | Admitting: Anesthesiology

## 2023-04-25 ENCOUNTER — Ambulatory Visit: Payer: 59 | Admitting: Urgent Care

## 2023-04-25 DIAGNOSIS — N813 Complete uterovaginal prolapse: Secondary | ICD-10-CM | POA: Diagnosis not present

## 2023-04-25 DIAGNOSIS — F17210 Nicotine dependence, cigarettes, uncomplicated: Secondary | ICD-10-CM | POA: Diagnosis not present

## 2023-04-25 DIAGNOSIS — K219 Gastro-esophageal reflux disease without esophagitis: Secondary | ICD-10-CM | POA: Diagnosis not present

## 2023-04-25 DIAGNOSIS — I10 Essential (primary) hypertension: Secondary | ICD-10-CM | POA: Insufficient documentation

## 2023-04-25 DIAGNOSIS — J449 Chronic obstructive pulmonary disease, unspecified: Secondary | ICD-10-CM | POA: Diagnosis not present

## 2023-04-25 DIAGNOSIS — M797 Fibromyalgia: Secondary | ICD-10-CM | POA: Insufficient documentation

## 2023-04-25 DIAGNOSIS — N838 Other noninflammatory disorders of ovary, fallopian tube and broad ligament: Secondary | ICD-10-CM | POA: Insufficient documentation

## 2023-04-25 DIAGNOSIS — Z01818 Encounter for other preprocedural examination: Secondary | ICD-10-CM

## 2023-04-25 DIAGNOSIS — E876 Hypokalemia: Secondary | ICD-10-CM

## 2023-04-25 DIAGNOSIS — N8110 Cystocele, unspecified: Secondary | ICD-10-CM | POA: Diagnosis present

## 2023-04-25 DIAGNOSIS — D573 Sickle-cell trait: Secondary | ICD-10-CM | POA: Diagnosis not present

## 2023-04-25 DIAGNOSIS — Z01812 Encounter for preprocedural laboratory examination: Secondary | ICD-10-CM

## 2023-04-25 HISTORY — PX: ANTERIOR AND POSTERIOR REPAIR: SHX5121

## 2023-04-25 HISTORY — PX: VAGINAL HYSTERECTOMY: SHX2639

## 2023-04-25 LAB — POCT I-STAT, CHEM 8
BUN: 7 mg/dL — ABNORMAL LOW (ref 8–23)
Calcium, Ion: 1.14 mmol/L — ABNORMAL LOW (ref 1.15–1.40)
Chloride: 105 mmol/L (ref 98–111)
Creatinine, Ser: 0.8 mg/dL (ref 0.44–1.00)
Glucose, Bld: 91 mg/dL (ref 70–99)
HCT: 43 % (ref 36.0–46.0)
Hemoglobin: 14.6 g/dL (ref 12.0–15.0)
Potassium: 3.7 mmol/L (ref 3.5–5.1)
Sodium: 141 mmol/L (ref 135–145)
TCO2: 26 mmol/L (ref 22–32)

## 2023-04-25 LAB — ABO/RH: ABO/RH(D): O POS

## 2023-04-25 SURGERY — HYSTERECTOMY, VAGINAL
Anesthesia: General

## 2023-04-25 MED ORDER — PHENYLEPHRINE 80 MCG/ML (10ML) SYRINGE FOR IV PUSH (FOR BLOOD PRESSURE SUPPORT)
PREFILLED_SYRINGE | INTRAVENOUS | Status: AC
  Filled 2023-04-25: qty 10

## 2023-04-25 MED ORDER — KETOROLAC TROMETHAMINE 30 MG/ML IJ SOLN
INTRAMUSCULAR | Status: DC | PRN
  Administered 2023-04-25: 15 mg via INTRAVENOUS

## 2023-04-25 MED ORDER — POVIDONE-IODINE 10 % EX SWAB: 2.0000 | Freq: Once | CUTANEOUS | Status: DC

## 2023-04-25 MED ORDER — EPHEDRINE 5 MG/ML INJ
INTRAVENOUS | Status: AC
  Filled 2023-04-25: qty 5

## 2023-04-25 MED ORDER — OXYCODONE HCL 5 MG PO TABS
5.0000 mg | ORAL_TABLET | Freq: Once | ORAL | Status: AC | PRN
  Administered 2023-04-25: 5 mg via ORAL

## 2023-04-25 MED ORDER — FENTANYL CITRATE (PF) 100 MCG/2ML IJ SOLN
INTRAMUSCULAR | Status: AC
  Filled 2023-04-25: qty 2

## 2023-04-25 MED ORDER — PROPOFOL 10 MG/ML IV BOLUS
INTRAVENOUS | Status: DC | PRN
  Administered 2023-04-25: 180 mg via INTRAVENOUS

## 2023-04-25 MED ORDER — GABAPENTIN 300 MG PO CAPS
300.0000 mg | ORAL_CAPSULE | ORAL | Status: AC
  Administered 2023-04-25: 300 mg via ORAL

## 2023-04-25 MED ORDER — PHENYLEPHRINE 80 MCG/ML (10ML) SYRINGE FOR IV PUSH (FOR BLOOD PRESSURE SUPPORT)
PREFILLED_SYRINGE | INTRAVENOUS | Status: DC | PRN
  Administered 2023-04-25: 120 ug via INTRAVENOUS

## 2023-04-25 MED ORDER — OXYCODONE HCL 5 MG PO TABS
ORAL_TABLET | ORAL | Status: AC
  Filled 2023-04-25: qty 1

## 2023-04-25 MED ORDER — GABAPENTIN 300 MG PO CAPS
ORAL_CAPSULE | ORAL | Status: AC
  Filled 2023-04-25: qty 1

## 2023-04-25 MED ORDER — CHLORHEXIDINE GLUCONATE 0.12 % MT SOLN
15.0000 mL | Freq: Once | OROMUCOSAL | Status: AC
  Administered 2023-04-25: 15 mL via OROMUCOSAL

## 2023-04-25 MED ORDER — CEFAZOLIN SODIUM-DEXTROSE 2-4 GM/100ML-% IV SOLN
2.0000 g | Freq: Once | INTRAVENOUS | Status: AC
  Administered 2023-04-25: 2 g via INTRAVENOUS

## 2023-04-25 MED ORDER — ROCURONIUM BROMIDE 100 MG/10ML IV SOLN
INTRAVENOUS | Status: DC | PRN
  Administered 2023-04-25: 50 mg via INTRAVENOUS

## 2023-04-25 MED ORDER — ONDANSETRON HCL 4 MG/2ML IJ SOLN
INTRAMUSCULAR | Status: DC | PRN
  Administered 2023-04-25: 4 mg via INTRAVENOUS

## 2023-04-25 MED ORDER — LIDOCAINE-EPINEPHRINE 1 %-1:100000 IJ SOLN
INTRAMUSCULAR | Status: AC
  Filled 2023-04-25: qty 1

## 2023-04-25 MED ORDER — KETOROLAC TROMETHAMINE 30 MG/ML IJ SOLN
INTRAMUSCULAR | Status: AC
  Filled 2023-04-25: qty 1

## 2023-04-25 MED ORDER — ORAL CARE MOUTH RINSE: 15.0000 mL | Freq: Once | OROMUCOSAL | Status: AC

## 2023-04-25 MED ORDER — MIDAZOLAM HCL 2 MG/2ML IJ SOLN
INTRAMUSCULAR | Status: DC | PRN
  Administered 2023-04-25: 2 mg via INTRAVENOUS

## 2023-04-25 MED ORDER — SUGAMMADEX SODIUM 200 MG/2ML IV SOLN
INTRAVENOUS | Status: DC | PRN
  Administered 2023-04-25: 80 mg via INTRAVENOUS

## 2023-04-25 MED ORDER — FAMOTIDINE 20 MG PO TABS
ORAL_TABLET | ORAL | Status: AC
  Filled 2023-04-25: qty 1

## 2023-04-25 MED ORDER — LACTATED RINGERS IV SOLN: INTRAVENOUS | Status: DC

## 2023-04-25 MED ORDER — DEXMEDETOMIDINE HCL IN NACL 80 MCG/20ML IV SOLN
INTRAVENOUS | Status: AC
  Filled 2023-04-25: qty 20

## 2023-04-25 MED ORDER — EPHEDRINE SULFATE (PRESSORS) 50 MG/ML IJ SOLN
INTRAMUSCULAR | Status: DC | PRN
  Administered 2023-04-25: 10 mg via INTRAVENOUS
  Administered 2023-04-25: 5 mg via INTRAVENOUS
  Administered 2023-04-25: 10 mg via INTRAVENOUS

## 2023-04-25 MED ORDER — LIDOCAINE HCL (PF) 2 % IJ SOLN
INTRAMUSCULAR | Status: AC
  Filled 2023-04-25: qty 5

## 2023-04-25 MED ORDER — ROCURONIUM BROMIDE 10 MG/ML (PF) SYRINGE
PREFILLED_SYRINGE | INTRAVENOUS | Status: AC
  Filled 2023-04-25: qty 10

## 2023-04-25 MED ORDER — MIDAZOLAM HCL 2 MG/2ML IJ SOLN
INTRAMUSCULAR | Status: AC
  Filled 2023-04-25: qty 2

## 2023-04-25 MED ORDER — 0.9 % SODIUM CHLORIDE (POUR BTL) OPTIME
TOPICAL | Status: DC | PRN
  Administered 2023-04-25: 500 mL

## 2023-04-25 MED ORDER — OXYCODONE HCL 5 MG PO TABS: 5.0000 mg | ORAL_TABLET | ORAL | Status: DC | PRN

## 2023-04-25 MED ORDER — ACETAMINOPHEN 500 MG PO TABS
1000.0000 mg | ORAL_TABLET | ORAL | Status: AC
  Administered 2023-04-25: 1000 mg via ORAL

## 2023-04-25 MED ORDER — CEFAZOLIN SODIUM-DEXTROSE 2-4 GM/100ML-% IV SOLN
INTRAVENOUS | Status: AC
  Filled 2023-04-25: qty 100

## 2023-04-25 MED ORDER — LIDOCAINE HCL (CARDIAC) PF 100 MG/5ML IV SOSY
PREFILLED_SYRINGE | INTRAVENOUS | Status: DC | PRN
  Administered 2023-04-25: 50 mg via INTRAVENOUS

## 2023-04-25 MED ORDER — CHLORHEXIDINE GLUCONATE 0.12 % MT SOLN
OROMUCOSAL | Status: AC
  Filled 2023-04-25: qty 15

## 2023-04-25 MED ORDER — ACETAMINOPHEN 500 MG PO TABS
ORAL_TABLET | ORAL | Status: AC
  Filled 2023-04-25: qty 2

## 2023-04-25 MED ORDER — PROPOFOL 10 MG/ML IV BOLUS
INTRAVENOUS | Status: AC
  Filled 2023-04-25: qty 20

## 2023-04-25 MED ORDER — ONDANSETRON 4 MG PO TBDP: 4.0000 mg | ORAL_TABLET | Freq: Three times a day (TID) | ORAL | Status: DC | PRN

## 2023-04-25 MED ORDER — FENTANYL CITRATE (PF) 100 MCG/2ML IJ SOLN
INTRAMUSCULAR | Status: DC | PRN
  Administered 2023-04-25: 50 ug via INTRAVENOUS
  Administered 2023-04-25: 25 ug via INTRAVENOUS
  Administered 2023-04-25: 50 ug via INTRAVENOUS
  Administered 2023-04-25: 25 ug via INTRAVENOUS

## 2023-04-25 MED ORDER — FAMOTIDINE 20 MG PO TABS
20.0000 mg | ORAL_TABLET | Freq: Once | ORAL | Status: AC
  Administered 2023-04-25: 20 mg via ORAL

## 2023-04-25 MED ORDER — OXYCODONE HCL 5 MG/5ML PO SOLN: 5.0000 mg | Freq: Once | ORAL | Status: AC | PRN

## 2023-04-25 MED ORDER — LIDOCAINE-EPINEPHRINE 1 %-1:100000 IJ SOLN
INTRAMUSCULAR | Status: DC | PRN
  Administered 2023-04-25: 14 mL

## 2023-04-25 MED ORDER — DEXMEDETOMIDINE HCL IN NACL 80 MCG/20ML IV SOLN
INTRAVENOUS | Status: DC | PRN
  Administered 2023-04-25: 4 ug via INTRAVENOUS
  Administered 2023-04-25: 12 ug via INTRAVENOUS

## 2023-04-25 MED ORDER — DEXAMETHASONE SODIUM PHOSPHATE 10 MG/ML IJ SOLN
INTRAMUSCULAR | Status: AC
  Filled 2023-04-25: qty 1

## 2023-04-25 MED ORDER — DEXAMETHASONE SODIUM PHOSPHATE 10 MG/ML IJ SOLN
INTRAMUSCULAR | Status: DC | PRN
  Administered 2023-04-25: 10 mg via INTRAVENOUS

## 2023-04-25 MED ORDER — OXYCODONE-ACETAMINOPHEN 5-325 MG PO TABS: 1.0000 | ORAL_TABLET | ORAL | Status: DC | PRN

## 2023-04-25 MED ORDER — ESTROGENS CONJUGATED 0.625 MG/GM VA CREA
TOPICAL_CREAM | VAGINAL | Status: AC
  Filled 2023-04-25: qty 30

## 2023-04-25 MED ORDER — FENTANYL CITRATE (PF) 100 MCG/2ML IJ SOLN: 25.0000 ug | INTRAMUSCULAR | Status: DC | PRN

## 2023-04-25 MED ORDER — ONDANSETRON HCL 4 MG/2ML IJ SOLN
INTRAMUSCULAR | Status: AC
  Filled 2023-04-25: qty 2

## 2023-04-25 SURGICAL SUPPLY — 50 items
BAG DRN RND TRDRP ANRFLXCHMBR (UROLOGICAL SUPPLIES) ×2
BAG URINE DRAIN 2000ML AR STRL (UROLOGICAL SUPPLIES) ×2 IMPLANT
BLADE SURG 15 STRL LF DISP TIS (BLADE) ×2 IMPLANT
BLADE SURG 15 STRL SS (BLADE) ×2
CATH FOLEY 2WAY  5CC 16FR (CATHETERS) ×2
CATH FOLEY 2WAY 5CC 16FR (CATHETERS) ×2
CATH ROBINSON RED A/P 16FR (CATHETERS) ×2 IMPLANT
CATH URTH 16FR FL 2W BLN LF (CATHETERS) ×2 IMPLANT
DRAPE PERI LITHO V/GYN (MISCELLANEOUS) ×2 IMPLANT
DRAPE SURG 17X11 SM STRL (DRAPES) ×2 IMPLANT
DRAPE UNDER BUTTOCK W/FLU (DRAPES) ×2 IMPLANT
ELECT REM PT RETURN 9FT ADLT (ELECTROSURGICAL) ×2
ELECTRODE REM PT RTRN 9FT ADLT (ELECTROSURGICAL) ×2 IMPLANT
GAUZE 4X4 16PLY ~~LOC~~+RFID DBL (SPONGE) ×4 IMPLANT
GAUZE PACK 2X3YD (PACKING) ×2 IMPLANT
GLOVE SURG SYN 8.0 (GLOVE) ×2 IMPLANT
GLOVE SURG SYN 8.0 PF PI (GLOVE) ×2 IMPLANT
GOWN STRL REUS W/ TWL LRG LVL3 (GOWN DISPOSABLE) ×6 IMPLANT
GOWN STRL REUS W/ TWL XL LVL3 (GOWN DISPOSABLE) ×2 IMPLANT
GOWN STRL REUS W/TWL LRG LVL3 (GOWN DISPOSABLE) ×6
GOWN STRL REUS W/TWL XL LVL3 (GOWN DISPOSABLE) ×2
KIT TURNOVER CYSTO (KITS) ×2 IMPLANT
KIT TURNOVER KIT A (KITS) ×2 IMPLANT
LABEL OR SOLS (LABEL) ×2 IMPLANT
MANIFOLD NEPTUNE II (INSTRUMENTS) ×2 IMPLANT
NDL HYPO 22X1.5 SAFETY MO (MISCELLANEOUS) ×2 IMPLANT
NEEDLE HYPO 22X1.5 SAFETY MO (MISCELLANEOUS) ×2 IMPLANT
NS IRRIG 1000ML POUR BTL (IV SOLUTION) ×2 IMPLANT
NS IRRIG 500ML POUR BTL (IV SOLUTION) ×2 IMPLANT
PACK BASIN MINOR ARMC (MISCELLANEOUS) ×2 IMPLANT
PAD OB MATERNITY 4.3X12.25 (PERSONAL CARE ITEMS) ×2 IMPLANT
PAD PREP OB/GYN DISP 24X41 (PERSONAL CARE ITEMS) ×2 IMPLANT
SCRUB CHG 4% DYNA-HEX 4OZ (MISCELLANEOUS) ×2 IMPLANT
SOL SCRUB PVP POV-IOD 4OZ 7.5% (MISCELLANEOUS) ×4
SOLUTION SCRB POV-IOD 4OZ 7.5% (MISCELLANEOUS) ×4 IMPLANT
SURGILUBE 2OZ TUBE FLIPTOP (MISCELLANEOUS) ×2 IMPLANT
SUT PDS 2-0 27IN (SUTURE) ×2 IMPLANT
SUT VIC AB 0 CT1 27 (SUTURE) ×4
SUT VIC AB 0 CT1 27XCR 8 STRN (SUTURE) ×4 IMPLANT
SUT VIC AB 0 CT1 36 (SUTURE) ×2 IMPLANT
SUT VIC AB 2-0 CT1 36 (SUTURE) IMPLANT
SUT VIC AB 2-0 SH 27 (SUTURE) ×6
SUT VIC AB 2-0 SH 27XBRD (SUTURE) ×6 IMPLANT
SUT VIC AB 3-0 SH 27 (SUTURE)
SUT VIC AB 3-0 SH 27X BRD (SUTURE) IMPLANT
SYR 10ML LL (SYRINGE) ×2 IMPLANT
SYR CONTROL 10ML LL (SYRINGE) ×2 IMPLANT
TRAP FLUID SMOKE EVACUATOR (MISCELLANEOUS) ×2 IMPLANT
WATER STERILE IRR 1000ML POUR (IV SOLUTION) ×2 IMPLANT
WATER STERILE IRR 500ML POUR (IV SOLUTION) ×2 IMPLANT

## 2023-04-25 NOTE — Brief Op Note (Signed)
04/25/2023  9:42 AM  PATIENT:  Colleen Murillo  67 y.o. female  PRE-OPERATIVE DIAGNOSIS:  uterine descensus with cystocele  POST-OPERATIVE DIAGNOSIS:  uterine descensus with cystocele  PROCEDURE:  Procedure(s): HYSTERECTOMY VAGINAL/ BILATERAL SALPINGO OOPHORECTOMY (Bilateral) ANTERIOR COLPORRHAPHY (N/A)  SURGEON:  Surgeon(s) and Role:    * Naveen Clardy, Ihor Austin, MD - Primary    * Christeen Douglas, MD - Assisting  PHYSICIAN ASSISTANT: PA student Cochran  ASSISTANTS: none   ANESTHESIA:   general  EBL:  20 mLIOF 600 cc , UO 400cc  BLOOD ADMINISTERED:none  DRAINS: none   LOCAL MEDICATIONS USED:  LIDOCAINE   SPECIMEN:  Source of Specimen:  cervix , uterus , bilateral fallopian tubes and ovaries   DISPOSITION OF SPECIMEN:  PATHOLOGY  COUNTS:  YES  TOURNIQUET:  * No tourniquets in log *  DICTATION: .Other Dictation: Dictation Number verbal   PLAN OF CARE: Discharge to home after PACU  PATIENT DISPOSITION:  PACU - hemodynamically stable.   Delay start of Pharmacological VTE agent (>24hrs) due to surgical blood loss or risk of bleeding: not applicable

## 2023-04-25 NOTE — Progress Notes (Signed)
Here for TVH and BSO and anterior colporrhaphy  . LAbs reviewed .K+ corrected  All questions answered . Proceed

## 2023-04-25 NOTE — Anesthesia Procedure Notes (Signed)
Procedure Name: Intubation Date/Time: 04/25/2023 7:40 AM  Performed by: Jeannene Patella, CRNAPre-anesthesia Checklist: Patient identified, Emergency Drugs available, Suction available, Patient being monitored and Timeout performed Patient Re-evaluated:Patient Re-evaluated prior to induction Oxygen Delivery Method: Circle system utilized Preoxygenation: Pre-oxygenation with 100% oxygen Induction Type: IV induction Ventilation: Mask ventilation without difficulty Laryngoscope Size: McGraph and 3 Grade View: Grade II Tube type: Oral Number of attempts: 1 Airway Equipment and Method: Stylet, Video-laryngoscopy and LTA kit utilized Placement Confirmation: ETT inserted through vocal cords under direct vision, positive ETCO2 and breath sounds checked- equal and bilateral Secured at: 19 (at teeth) cm Dental Injury: Teeth and Oropharynx as per pre-operative assessment  Comments: Short chin

## 2023-04-25 NOTE — Transfer of Care (Signed)
Immediate Anesthesia Transfer of Care Note  Patient: Colleen Murillo  Procedure(s) Performed: HYSTERECTOMY VAGINAL/ BILATERAL SALPINGO OOPHORECTOMY (Bilateral) ANTERIOR COLPORRHAPHY  Patient Location: PACU  Anesthesia Type:General  Level of Consciousness: unresponsive  Airway & Oxygen Therapy: Patient Spontanous Breathing and Patient connected to face mask oxygen  Post-op Assessment: Report given to RN and Post -op Vital signs reviewed and stable  Post vital signs: Reviewed and stable  Last Vitals:  Vitals Value Taken Time  BP 126/67 04/25/23 0949  Temp    Pulse 72 04/25/23 0952  Resp 17 04/25/23 0952  SpO2 100 % 04/25/23 0952  Vitals shown include unvalidated device data.  Last Pain:  Vitals:   04/25/23 0630  TempSrc: Temporal  PainSc: 0-No pain         Complications: No notable events documented.

## 2023-04-25 NOTE — Anesthesia Postprocedure Evaluation (Signed)
Anesthesia Post Note  Patient: Colleen Murillo  Procedure(s) Performed: HYSTERECTOMY VAGINAL/ BILATERAL SALPINGO OOPHORECTOMY (Bilateral) ANTERIOR COLPORRHAPHY  Patient location during evaluation: PACU Anesthesia Type: General Level of consciousness: awake and alert Pain management: pain level controlled Vital Signs Assessment: post-procedure vital signs reviewed and stable Respiratory status: spontaneous breathing, nonlabored ventilation, respiratory function stable and patient connected to nasal cannula oxygen Cardiovascular status: blood pressure returned to baseline and stable Postop Assessment: no apparent nausea or vomiting Anesthetic complications: no   No notable events documented.   Last Vitals:  Vitals:   04/25/23 1104 04/25/23 1141  BP: 116/69 115/78  Pulse: 76 68  Resp: 14 16  Temp: (!) 36.2 C (!) 36.1 C  SpO2: 97% 100%    Last Pain:  Vitals:   04/25/23 1141  TempSrc: Temporal  PainSc: 3                  Cleda Mccreedy Xavious Sharrar

## 2023-04-25 NOTE — Progress Notes (Signed)
Called into OR and informed dr schermerhorn of voiding volume and post void bladder scan.  Ok for pt to be discharged per dr schermerhorn.

## 2023-04-25 NOTE — Op Note (Signed)
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  PROCEDURES: 1.  Total vaginal hysterectomy with bilateral salpingo-oophorectomy. 2.  Anterior colporrhaphy.  SURGEON:  Suzy Bouchard, MD  FIRST ASSISTANT:  Christeen Douglas.  SECOND ASSISTANT:  PA student, Berna Bue.  ANESTHESIA:  General endotracheal anesthesia.  INDICATIONS:  67 year old gravida 3, para 3 female with a history of tissue falling from the vagina.  The patient has a third-degree cystocele and third-degree uterine descensus on examination.  DESCRIPTION OF PROCEDURE:  After adequate general endotracheal anesthesia, the patient was placed in dorsal supine position with legs in the candy cane stirrups.  The patient's lower abdomen, perineum and vagina were prepped and draped in normal sterile  fashion.  Timeout was performed.  The patient did receive 2 grams of IV Ancef prior to commencement of the case.  Straight catheterization of the bladder yielded 300 mL urine.  Weighted speculum was placed in the posterior vaginal vault and the cervix  was grasped with 2 thyroid tenacula.  Cervix was circumferentially injected with 1% lidocaine with 1:100,000 epinephrine.  A direct posterior colpotomy incision was made and upon entry into the posterior cul-de-sac, the long billed weighted speculum was  placed.  Uterosacral ligaments were then bilaterally clamped, transected and suture ligated with 0 Vicryl suture.  Anterior cervix was circumferentially incised with the Bovie and the cardinal ligaments were then bilaterally clamped,  transected and  suture ligated with 0 Vicryl suture.  Anterior cul-de-sac was entered sharply and the Deaver retractor was placed within and the bladder was re-elevated anteriorly.  Uterine arteries were then bilaterally clamped, transected and suture ligated with 0  Vicryl suture.  The cornua were then bilaterally clamped and the cervix and uterus were delivered.  Cornual ligaments were bilaterally sutured. Cornual pedicles were doubly ligated with 0 Vicryl suture.  Each ovary with fallopian tube were identified,  grasped with a Babcock clamp and Heaney ballantine clamp was placed on the infundibulopelvic ligaments bilaterally.  Each ovary and fallopian tube were removed and each pedicle was doubly ligated with 0 Vicryl suture.  Good hemostasis noted.  The  peritoneum was then closed in a pursestring fashion with 2-0 PDS suture.  The bladder was drained again yielding additional 100 mL of urine.  Attention was directed to the cystocele, which the central portion of the anterior vaginal epithelium were  grasped and placed on tension with Allis clamps.  The subepithelial vaginal tissues were injected with 1% lidocaine with 1:100,000 epinephrine.  The vaginal epithelium was opened centrally to 1.5 cm from the urethral meatus.  The subvaginal epithelial  tissue was dissected off the epithelium.  Healthy endopelvic fascia was identified and the defect was closed with 3 separate 2-0 Vicryl mattress sutures with reduction of the cystocele.  The vaginal tissues were then trimmed and were then sutured  centrally with 0 Vicryl suture with continuation of closure of the vaginal cuff from the hysterectomy.  Uterosacral ligaments were plicated centrally and the rest of the vaginal cuff was closed.  Good reduction of tissue.  Good cosmetic effect noted.   Good hemostasis noted.  There were no complications.  The bladder was re-drained at the end of the case, yielding additional  20 mL of urine.  ESTIMATED BLOOD LOSS:   20 mL.  INTRAOPERATIVE FLUIDS:  600 mL  URINE OUTPUT:  400 mL.  The patient did receive 15 mg intravenous Toradol at the end of the procedure and was taken to recovery room in good condition.   PUS D: 04/25/2023 10:26:38 am T: 04/25/2023 11:01:00 am  JOB: [id number]/ [id number]

## 2023-04-25 NOTE — Discharge Instructions (Signed)

## 2023-04-25 NOTE — Anesthesia Preprocedure Evaluation (Signed)
Anesthesia Evaluation  Patient identified by MRN, date of birth, ID band Patient awake    Reviewed: Allergy & Precautions, NPO status , Patient's Chart, lab work & pertinent test results  History of Anesthesia Complications Negative for: history of anesthetic complications  Airway Mallampati: III  TM Distance: >3 FB Neck ROM: full    Dental  (+) Chipped, Poor Dentition, Missing   Pulmonary shortness of breath and with exertion, COPD, Current Smoker and Patient abstained from smoking.   Pulmonary exam normal        Cardiovascular hypertension, (-) angina Normal cardiovascular exam+ dysrhythmias      Neuro/Psych  PSYCHIATRIC DISORDERS       Neuromuscular disease    GI/Hepatic Neg liver ROS,GERD  Controlled,,  Endo/Other  negative endocrine ROS    Renal/GU      Musculoskeletal   Abdominal   Peds  Hematology negative hematology ROS (+)   Anesthesia Other Findings Past Medical History: No date: Anxiety No date: Arthritis No date: Back pain No date: COPD (chronic obstructive pulmonary disease) (HCC) No date: Dysrhythmia No date: Fibromyalgia No date: GERD (gastroesophageal reflux disease) No date: Hypertension No date: Osteoporosis No date: Sickle cell trait (HCC)  Past Surgical History: 04/08/2018: COLONOSCOPY WITH PROPOFOL; N/A     Comment:  Procedure: COLONOSCOPY WITH PROPOFOL;  Surgeon:               Christena Deem, MD;  Location: [facility] ENDOSCOPY;                Service: Endoscopy;  Laterality: N/A; 04/08/2018: ESOPHAGOGASTRODUODENOSCOPY (EGD) WITH PROPOFOL; N/A     Comment:  Procedure: ESOPHAGOGASTRODUODENOSCOPY (EGD) WITH               PROPOFOL;  Surgeon: Christena Deem, MD;  Location:               [facility] ENDOSCOPY;  Service: Endoscopy;  Laterality: N/A; No date: EYE SURGERY     Comment:  cataract surgery both eyes 2011: ROTATOR CUFF REPAIR; Right  BMI    Body Mass Index: 23.63 kg/m       Reproductive/Obstetrics negative OB ROS                             Anesthesia Physical Anesthesia Plan  ASA: 3  Anesthesia Plan: General ETT   Post-op Pain Management:    Induction: Intravenous  PONV Risk Score and Plan: Ondansetron, Dexamethasone, Midazolam and Treatment may vary due to age or medical condition  Airway Management Planned: Oral ETT  Additional Equipment:   Intra-op Plan:   Post-operative Plan: Extubation in OR and Possible Post-op intubation/ventilation  Informed Consent: I have reviewed the patients History and Physical, chart, labs and discussed the procedure including the risks, benefits and alternatives for the proposed anesthesia with the patient or authorized representative who has indicated his/her understanding and acceptance.     Dental Advisory Given  Plan Discussed with: Anesthesiologist, CRNA and Surgeon  Anesthesia Plan Comments: (Patient consented for risks of anesthesia including but not limited to:  - adverse reactions to medications - damage to eyes, teeth, lips or other oral mucosa - nerve damage due to positioning  - sore throat or hoarseness - Damage to heart, brain, nerves, lungs, other parts of body or loss of life  Patient voiced understanding.)       Anesthesia Quick Evaluation

## 2023-04-27 ENCOUNTER — Other Ambulatory Visit: Payer: Self-pay

## 2023-05-01 LAB — SURGICAL PATHOLOGY

## 2023-08-23 ENCOUNTER — Other Ambulatory Visit: Payer: Self-pay | Admitting: Gastroenterology

## 2023-08-23 DIAGNOSIS — R1310 Dysphagia, unspecified: Secondary | ICD-10-CM

## 2023-09-03 ENCOUNTER — Ambulatory Visit
Admission: RE | Admit: 2023-09-03 | Discharge: 2023-09-03 | Disposition: A | Discharge status: Home / Self Care | Payer: 59 | Source: Ambulatory Visit | Attending: Gastroenterology | Admitting: Gastroenterology

## 2023-09-03 DIAGNOSIS — R1310 Dysphagia, unspecified: Secondary | ICD-10-CM | POA: Diagnosis present

## 2023-10-30 ENCOUNTER — Encounter: Payer: Self-pay | Admitting: *Deleted

## 2023-12-03 ENCOUNTER — Ambulatory Visit: Admission: RE | Admit: 2023-12-03 | Payer: 59 | Source: Home / Self Care

## 2023-12-03 HISTORY — DX: Palpitations: R00.2

## 2023-12-03 HISTORY — DX: Diverticulosis of intestine, part unspecified, without perforation or abscess without bleeding: K57.90

## 2023-12-03 HISTORY — DX: Dyspnea, unspecified: R06.00

## 2023-12-03 SURGERY — COLONOSCOPY WITH PROPOFOL
Anesthesia: General

## 2024-02-14 ENCOUNTER — Ambulatory Visit: Admitting: Anesthesiology

## 2024-02-14 ENCOUNTER — Encounter: Payer: Self-pay | Admitting: Gastroenterology

## 2024-02-14 ENCOUNTER — Encounter
Admission: RE | Disposition: A | Discharge status: Home / Self Care | Payer: Self-pay | Source: Home / Self Care | Attending: Gastroenterology

## 2024-02-14 ENCOUNTER — Ambulatory Visit
Admission: RE | Admit: 2024-02-14 | Discharge: 2024-02-14 | Disposition: A | Discharge status: Home / Self Care | Payer: 59 | Attending: Gastroenterology | Admitting: Gastroenterology

## 2024-02-14 DIAGNOSIS — D573 Sickle-cell trait: Secondary | ICD-10-CM | POA: Insufficient documentation

## 2024-02-14 DIAGNOSIS — J449 Chronic obstructive pulmonary disease, unspecified: Secondary | ICD-10-CM | POA: Insufficient documentation

## 2024-02-14 DIAGNOSIS — I10 Essential (primary) hypertension: Secondary | ICD-10-CM | POA: Diagnosis not present

## 2024-02-14 DIAGNOSIS — F17200 Nicotine dependence, unspecified, uncomplicated: Secondary | ICD-10-CM | POA: Diagnosis not present

## 2024-02-14 DIAGNOSIS — D124 Benign neoplasm of descending colon: Secondary | ICD-10-CM | POA: Insufficient documentation

## 2024-02-14 DIAGNOSIS — K219 Gastro-esophageal reflux disease without esophagitis: Secondary | ICD-10-CM | POA: Diagnosis not present

## 2024-02-14 DIAGNOSIS — R0602 Shortness of breath: Secondary | ICD-10-CM | POA: Diagnosis not present

## 2024-02-14 DIAGNOSIS — K2970 Gastritis, unspecified, without bleeding: Secondary | ICD-10-CM | POA: Insufficient documentation

## 2024-02-14 DIAGNOSIS — K635 Polyp of colon: Secondary | ICD-10-CM | POA: Diagnosis not present

## 2024-02-14 DIAGNOSIS — M797 Fibromyalgia: Secondary | ICD-10-CM | POA: Diagnosis not present

## 2024-02-14 DIAGNOSIS — K2289 Other specified disease of esophagus: Secondary | ICD-10-CM | POA: Insufficient documentation

## 2024-02-14 DIAGNOSIS — K5730 Diverticulosis of large intestine without perforation or abscess without bleeding: Secondary | ICD-10-CM | POA: Insufficient documentation

## 2024-02-14 DIAGNOSIS — Z1211 Encounter for screening for malignant neoplasm of colon: Secondary | ICD-10-CM | POA: Diagnosis present

## 2024-02-14 DIAGNOSIS — Z79899 Other long term (current) drug therapy: Secondary | ICD-10-CM | POA: Insufficient documentation

## 2024-02-14 DIAGNOSIS — D122 Benign neoplasm of ascending colon: Secondary | ICD-10-CM | POA: Insufficient documentation

## 2024-02-14 DIAGNOSIS — K449 Diaphragmatic hernia without obstruction or gangrene: Secondary | ICD-10-CM | POA: Diagnosis not present

## 2024-02-14 DIAGNOSIS — Z7951 Long term (current) use of inhaled steroids: Secondary | ICD-10-CM | POA: Diagnosis not present

## 2024-02-14 DIAGNOSIS — K224 Dyskinesia of esophagus: Secondary | ICD-10-CM | POA: Insufficient documentation

## 2024-02-14 DIAGNOSIS — R1310 Dysphagia, unspecified: Secondary | ICD-10-CM | POA: Diagnosis not present

## 2024-02-14 HISTORY — DX: Prediabetes: R73.03

## 2024-02-14 HISTORY — PX: POLYPECTOMY: SHX5525

## 2024-02-14 HISTORY — PX: COLONOSCOPY WITH PROPOFOL: SHX5780

## 2024-02-14 HISTORY — PX: ESOPHAGOGASTRODUODENOSCOPY (EGD) WITH PROPOFOL: SHX5813

## 2024-02-14 LAB — GLUCOSE, CAPILLARY: Glucose-Capillary: 92 mg/dL (ref 70–99)

## 2024-02-14 SURGERY — COLONOSCOPY WITH PROPOFOL
Anesthesia: General

## 2024-02-14 MED ORDER — LIDOCAINE HCL (CARDIAC) PF 100 MG/5ML IV SOSY
PREFILLED_SYRINGE | INTRAVENOUS | Status: DC | PRN
  Administered 2024-02-14: 50 mg via INTRAVENOUS

## 2024-02-14 MED ORDER — SODIUM CHLORIDE 0.9 % IV SOLN: INTRAVENOUS | Status: DC | PRN

## 2024-02-14 MED ORDER — PROPOFOL 500 MG/50ML IV EMUL
INTRAVENOUS | Status: DC | PRN
  Administered 2024-02-14: 20 mg via INTRAVENOUS
  Administered 2024-02-14: 150 ug/kg/min via INTRAVENOUS
  Administered 2024-02-14: 50 mg via INTRAVENOUS
  Administered 2024-02-14: 20 mg via INTRAVENOUS

## 2024-02-14 MED ORDER — SODIUM CHLORIDE 0.9 % IV SOLN: INTRAVENOUS | Status: DC

## 2024-02-14 MED ORDER — GLYCOPYRROLATE 0.2 MG/ML IJ SOLN
INTRAMUSCULAR | Status: DC | PRN
  Administered 2024-02-14: .2 mg via INTRAVENOUS

## 2024-02-14 NOTE — Transfer of Care (Signed)
 Immediate Anesthesia Transfer of Care Note  Patient: Colleen Murillo  Procedure(s) Performed: COLONOSCOPY WITH PROPOFOL ESOPHAGOGASTRODUODENOSCOPY (EGD) WITH PROPOFOL POLYPECTOMY  Patient Location: Endoscopy Unit  Anesthesia Type:General  Level of Consciousness: drowsy and patient cooperative  Airway & Oxygen Therapy: Patient Spontanous Breathing and Patient connected to face mask oxygen  Post-op Assessment: Report given to RN and Post -op Vital signs reviewed and stable  Post vital signs: Reviewed and stable  Last Vitals:  Vitals Value Taken Time  BP 113/68 02/14/24 1509  Temp 35.7 C 02/14/24 1509  Pulse 68 02/14/24 1510  Resp 19 02/14/24 1510  SpO2 94 % 02/14/24 1510  Vitals shown include unfiled device data.  Last Pain:  Vitals:   02/14/24 1509  TempSrc: Temporal  PainSc: Asleep         Complications: No notable events documented.

## 2024-02-14 NOTE — H&P (Signed)
 Pre-Procedure H&P   Patient ID: Colleen Murillo is a 68 y.o. female.  Gastroenterology Provider: Jaynie Collins, DO  Referring Provider: Tawni Pummel, PA PCP: Mariel Kansky, MD  Date: 02/14/2024  HPI Ms. Colleen Murillo is a 68 y.o. female who presents today for Esophagogastroduodenoscopy and Colonoscopy for Dysphagia, GERD, personal history of colon polyps .  Patient notes dysphagia with liquids and solids.  Occasionally chokes on her own saliva.  She has noted increased belching and gas.  She has a history of H. pylori status posttreatment.  A barium swallow study did demonstrate a small hiatal hernia, reflux and normal passage of her barium tablet.  Bowel regimen regular without melena or hematochezia.  She does note at times she has some incomplete emptying.  She also has some loss of stool and urine with coughing.  Last EGD and colonoscopy in May 2014 demonstrating 2 tubular adenomas left-sided diverticulosis.  Biopsies negative for microscopic colitis.  Grade a esophagitis noted  Status post hysterectomy  Hemoglobin 16.4 MCV 80 platelets 400,000 H. pylori breath testing negative creatinine 0.67   Past Medical History:  Diagnosis Date   Anxiety    Arthritis    Back pain    COPD (chronic obstructive pulmonary disease) (HCC)    Diverticulosis    Dyspnea    Dysrhythmia    Fibromyalgia    GERD (gastroesophageal reflux disease)    Hypertension    Osteoporosis    Palpitations    Pre-diabetes    Sickle cell trait (HCC)     Past Surgical History:  Procedure Laterality Date   ANTERIOR AND POSTERIOR REPAIR N/A 04/25/2023   Procedure: ANTERIOR COLPORRHAPHY;  Surgeon: Schermerhorn, Ihor Austin, MD;  Location: [facility] ORS;  Service: Gynecology;  Laterality: N/A;   COLONOSCOPY WITH PROPOFOL N/A 04/08/2018   Procedure: COLONOSCOPY WITH PROPOFOL;  Surgeon: Christena Deem, MD;  Location: [facility] ENDOSCOPY;  Service: Endoscopy;  Laterality: N/A;    ESOPHAGOGASTRODUODENOSCOPY (EGD) WITH PROPOFOL N/A 04/08/2018   Procedure: ESOPHAGOGASTRODUODENOSCOPY (EGD) WITH PROPOFOL;  Surgeon: Christena Deem, MD;  Location: [facility] ENDOSCOPY;  Service: Endoscopy;  Laterality: N/A;   EYE SURGERY     cataract surgery both eyes   ROTATOR CUFF REPAIR Right 2011   VAGINAL HYSTERECTOMY Bilateral 04/25/2023   Procedure: HYSTERECTOMY VAGINAL/ BILATERAL SALPINGO OOPHORECTOMY;  Surgeon: Schermerhorn, Ihor Austin, MD;  Location: [facility] ORS;  Service: Gynecology;  Laterality: Bilateral;    Family History No h/o GI disease or malignancy  Review of Systems  Constitutional:  Negative for activity change, appetite change, chills, diaphoresis, fatigue, fever and unexpected weight change.  HENT:  Positive for trouble swallowing. Negative for voice change.   Respiratory:  Negative for shortness of breath and wheezing.   Cardiovascular:  Negative for chest pain, palpitations and leg swelling.  Gastrointestinal:  Positive for abdominal pain. Negative for abdominal distention, anal bleeding, blood in stool, constipation, diarrhea, nausea, rectal pain and vomiting.  Musculoskeletal:  Negative for arthralgias and myalgias.  Skin:  Negative for color change and pallor.  Neurological:  Negative for dizziness, syncope and weakness.  Psychiatric/Behavioral:  Negative for confusion.   All other systems reviewed and are negative.    Medications No current facility-administered medications on file prior to encounter.   Current Outpatient Medications on File Prior to Encounter  Medication Sig Dispense Refill   albuterol (VENTOLIN HFA) 108 (90 Base) MCG/ACT inhaler Inhale 2 puffs into the lungs every 6 (six) hours as needed for wheezing or shortness of breath.  amLODipine (NORVASC) 5 MG tablet Take 5 mg by mouth daily.     budesonide-formoterol (SYMBICORT) 160-4.5 MCG/ACT inhaler Inhale 2 puffs into the lungs 2 (two) times daily.     cyanocobalamin (VITAMIN B12) 250 MCG  tablet Take 250 mcg by mouth once a week.     metoprolol tartrate (LOPRESSOR) 25 MG tablet Take 25 mg by mouth 2 (two) times daily.     pantoprazole (PROTONIX) 40 MG tablet Take 40 mg by mouth daily.     acetaminophen (TYLENOL) 325 MG tablet Take 650 mg by mouth every 6 (six) hours as needed for mild pain.     calcium carbonate (OSCAL) 1500 (600 Ca) MG TABS tablet Take 600 mg by mouth daily with breakfast. (Patient not taking: Reported on 04/19/2023)     Melatonin 10 MG TABS Take 10 mg by mouth daily. (Patient not taking: Reported on 04/19/2023)     potassium chloride (KLOR-CON) 10 MEQ tablet Take 2 tablets (20 mEq) today, then 1 tablet (10 mEq) daily until surgery. Take dose on day of surgery. Follow up with PCP for repeat labs. 7 tablet 0    Pertinent medications related to GI and procedure were reviewed by me with the patient prior to the procedure   Current Facility-Administered Medications:    0.9 %  sodium chloride infusion, , Intravenous, Continuous, Jaynie Collins, DO, Last Rate: 20 mL/hr at 02/14/24 1352, New Bag at 02/14/24 1352  sodium chloride 20 mL/hr at 02/14/24 1352       Allergies  Allergen Reactions   Augmentin [Amoxicillin-Pot Clavulanate] Nausea Only   Allergies were reviewed by me prior to the procedure  Objective   Body mass index is 22.26 kg/m. Vitals:   02/14/24 1344  BP: 110/78  Pulse: 60  Resp: 18  Temp: (!) 97 F (36.1 C)  TempSrc: Temporal  SpO2: 100%  Weight: 50 kg  Height: 4\' 11"  (1.499 m)     Physical Exam Vitals and nursing note reviewed.  Constitutional:      General: She is not in acute distress.    Appearance: Normal appearance. She is not ill-appearing, toxic-appearing or diaphoretic.  HENT:     Head: Normocephalic and atraumatic.     Nose: Nose normal.     Mouth/Throat:     Mouth: Mucous membranes are moist.     Pharynx: Oropharynx is clear.  Eyes:     General: No scleral icterus.    Extraocular Movements: Extraocular  movements intact.  Cardiovascular:     Rate and Rhythm: Normal rate and regular rhythm.     Heart sounds: Normal heart sounds. No murmur heard.    No friction rub. No gallop.  Pulmonary:     Effort: Pulmonary effort is normal. No respiratory distress.     Breath sounds: Normal breath sounds. No wheezing, rhonchi or rales.  Abdominal:     General: Bowel sounds are normal. There is no distension.     Palpations: Abdomen is soft.     Tenderness: There is no abdominal tenderness. There is no guarding or rebound.  Musculoskeletal:     Cervical back: Neck supple.     Right lower leg: No edema.     Left lower leg: No edema.  Skin:    General: Skin is warm and dry.     Coloration: Skin is not jaundiced or pale.  Neurological:     General: No focal deficit present.     Mental Status: She is alert and oriented to  person, place, and time. Mental status is at baseline.  Psychiatric:        Mood and Affect: Mood normal.        Behavior: Behavior normal.        Thought Content: Thought content normal.        Judgment: Judgment normal.      Assessment:  Ms. Colleen Murillo is a 68 y.o. female  who presents today for Esophagogastroduodenoscopy and Colonoscopy for Dysphagia, GERD, personal history of colon polyps .  Plan:  Esophagogastroduodenoscopy and Colonoscopy with possible intervention today  Esophagogastroduodenoscopy and Colonoscopy with possible biopsy, control of bleeding, polypectomy, and interventions as necessary has been discussed with the patient/patient representative. Informed consent was obtained from the patient/patient representative after explaining the indication, nature, and risks of the procedure including but not limited to death, bleeding, perforation, missed neoplasm/lesions, cardiorespiratory compromise, and reaction to medications. Opportunity for questions was given and appropriate answers were provided. Patient/patient representative has verbalized understanding is  amenable to undergoing the procedure.   Jaynie Collins, DO  [facility]  Portions of the record may have been created with voice recognition software. Occasional wrong-word or 'sound-a-like' substitutions may have occurred due to the inherent limitations of voice recognition software.  Read the chart carefully and recognize, using context, where substitutions may have occurred.

## 2024-02-14 NOTE — Op Note (Signed)
 [facility] Gastroenterology Patient Name: Colleen Murillo Procedure Date: 02/14/2024 2:21 PM MRN: [id number] Account #: [id number] Date of Birth: January 04, 1956 Admit Type: Outpatient Age: 68 Room: [facility] ENDO ROOM 1 Gender: Female Note Status: Finalized Instrument Name: Peds Colonoscope [id number] Procedure:             Colonoscopy Indications:           High risk colon cancer surveillance: Personal history                         of colonic polyps Providers:             Trenda Moots, DO Referring MD:          Jaynie Collins DO, DO (Referring MD), Kenney Houseman, MD (Referring MD) Medicines:             Monitored Anesthesia Care Complications:         No immediate complications. Estimated blood loss:                         Minimal. Procedure:             Pre-Anesthesia Assessment:                        - Prior to the procedure, a History and Physical was                         performed, and patient medications and allergies were                         reviewed. The patient is competent. The risks and                         benefits of the procedure and the sedation options and                         risks were discussed with the patient. All questions                         were answered and informed consent was obtained.                         Patient identification and proposed procedure were                         verified by the physician, the nurse, the anesthetist                         and the technician in the endoscopy suite. Mental                         Status Examination: alert and oriented. Airway                         Examination: normal oropharyngeal airway and neck  mobility. Respiratory Examination: clear to                         auscultation. CV Examination: RRR, no murmurs, no S3                         or S4. Prophylactic Antibiotics: The patient does not                          require prophylactic antibiotics. Prior                         Anticoagulants: The patient has taken no anticoagulant                         or antiplatelet agents. ASA Grade Assessment: III - A                         patient with severe systemic disease. After reviewing                         the risks and benefits, the patient was deemed in                         satisfactory condition to undergo the procedure. The                         anesthesia plan was to use monitored anesthesia care                         (MAC). Immediately prior to administration of                         medications, the patient was re-assessed for adequacy                         to receive sedatives. The heart rate, respiratory                         rate, oxygen saturations, blood pressure, adequacy of                         pulmonary ventilation, and response to care were                         monitored throughout the procedure. The physical                         status of the patient was re-assessed after the                         procedure.                        After obtaining informed consent, the colonoscope was                         passed under direct vision. Throughout the procedure,  the patient's blood pressure, pulse, and oxygen                         saturations were monitored continuously. The                         Colonoscope was introduced through the anus and                         advanced to the the terminal ileum, with                         identification of the appendiceal orifice and IC                         valve. The colonoscopy was performed without                         difficulty. The patient tolerated the procedure well.                         The quality of the bowel preparation was evaluated                         using the BBPS [facility] Bowel Preparation Scale) with                         scores of: Right Colon = 2  (minor amount of residual                         staining, small fragments of stool and/or opaque                         liquid, but mucosa seen well), Transverse Colon = 2                         (minor amount of residual staining, small fragments of                         stool and/or opaque liquid, but mucosa seen well) and                         Left Colon = 2 (minor amount of residual staining,                         small fragments of stool and/or opaque liquid, but                         mucosa seen well). The total BBPS score equals 6. Fair                         Prep. The terminal ileum, ileocecal valve, appendiceal                         orifice, and rectum were photographed. Findings:      The perianal and digital rectal examinations were normal. Pertinent       negatives include  normal sphincter tone.      The terminal ileum appeared normal. Estimated blood loss: none.      Three sessile polyps were found in the recto-sigmoid colon, descending       colon and ascending colon. The polyps were 1 to 2 mm in size. These       polyps were removed with a jumbo cold forceps. Resection and retrieval       were complete. Estimated blood loss was minimal.      Multiple small-mouthed diverticula were found in the left colon.       Estimated blood loss: none.      The exam was otherwise without abnormality on direct and retroflexion       views. Impression:            - The examined portion of the ileum was normal.                        - Three 1 to 2 mm polyps at the recto-sigmoid colon,                         in the descending colon and in the ascending colon,                         removed with a jumbo cold forceps. Resected and                         retrieved.                        - Diverticulosis in the left colon.                        - The examination was otherwise normal on direct and                         retroflexion views. Recommendation:        - Patient  has a contact number available for                         emergencies. The signs and symptoms of potential                         delayed complications were discussed with the patient.                         Return to normal activities tomorrow. Written                         discharge instructions were provided to the patient.                        - Discharge patient to home.                        - Resume previous diet.                        - Continue present medications.                        -  Await pathology results.                        - Repeat colonoscopy for surveillance based on                         pathology results.                        - Return to GI office as previously scheduled.                        - The findings and recommendations were discussed with                         the patient.                        - The findings and recommendations were discussed with                         the patient's family. Procedure Code(s):     --- Professional ---                        [phone], Colonoscopy, flexible; with biopsy, single or                         multiple Diagnosis Code(s):     --- Professional ---                        Z86.010, Personal history of colonic polyps                        D12.7, Benign neoplasm of rectosigmoid junction                        D12.4, Benign neoplasm of descending colon                        D12.2, Benign neoplasm of ascending colon                        K57.30, Diverticulosis of large intestine without                         perforation or abscess without bleeding CPT copyright 2022 American [employer] Association. All rights reserved. The codes documented in this report are preliminary and upon coder review may  be revised to meet current compliance requirements. Attending Participation:      I personally performed the entire procedure. Elfredia Nevins, DO Jaynie Collins DO, DO 02/14/2024 3:11:11 PM This  report has been signed electronically. Number of Addenda: 0 Note Initiated On: 02/14/2024 2:21 PM Scope Withdrawal Time: 0 hours 10 minutes 20 seconds  Total Procedure Duration: 0 hours 12 minutes 58 seconds  Estimated Blood Loss:  Estimated blood loss was minimal.      [facility]

## 2024-02-14 NOTE — Anesthesia Preprocedure Evaluation (Signed)
 Anesthesia Evaluation  Patient identified by MRN, date of birth, ID band Patient awake    Reviewed: Allergy & Precautions, NPO status , Patient's Chart, lab work & pertinent test results  History of Anesthesia Complications Negative for: history of anesthetic complications  Airway Mallampati: III  TM Distance: <3 FB Neck ROM: full    Dental  (+) Chipped, Poor Dentition, Missing   Pulmonary shortness of breath and with exertion, COPD, Current Smoker   Pulmonary exam normal        Cardiovascular Exercise Tolerance: Good hypertension, (-) angina Normal cardiovascular exam+ dysrhythmias      Neuro/Psych  Neuromuscular disease  negative psych ROS   GI/Hepatic Neg liver ROS,GERD  Controlled,,  Endo/Other  negative endocrine ROS    Renal/GU negative Renal ROS  negative genitourinary   Musculoskeletal   Abdominal   Peds  Hematology negative hematology ROS (+)   Anesthesia Other Findings Patient reports that they do not think that any food or pills are stuck in their throat at this time.  Past Medical History: No date: Anxiety No date: Arthritis No date: Back pain No date: COPD (chronic obstructive pulmonary disease) (HCC) No date: Diverticulosis No date: Dyspnea No date: Dysrhythmia No date: Fibromyalgia No date: GERD (gastroesophageal reflux disease) No date: Hypertension No date: Osteoporosis No date: Palpitations No date: Pre-diabetes No date: Sickle cell trait [facility])  Past Surgical History: 04/25/2023: ANTERIOR AND POSTERIOR REPAIR; N/A     Comment:  Procedure: ANTERIOR COLPORRHAPHY;  Surgeon:               Schermerhorn, Ihor Austin, MD;  Location: [facility] ORS;                Service: Gynecology;  Laterality: N/A; 04/08/2018: COLONOSCOPY WITH PROPOFOL; N/A     Comment:  Procedure: COLONOSCOPY WITH PROPOFOL;  Surgeon:               Christena Deem, MD;  Location: [facility] ENDOSCOPY;                Service:  Endoscopy;  Laterality: N/A; 04/08/2018: ESOPHAGOGASTRODUODENOSCOPY (EGD) WITH PROPOFOL; N/A     Comment:  Procedure: ESOPHAGOGASTRODUODENOSCOPY (EGD) WITH               PROPOFOL;  Surgeon: Christena Deem, MD;  Location:               [facility] ENDOSCOPY;  Service: Endoscopy;  Laterality: N/A; No date: EYE SURGERY     Comment:  cataract surgery both eyes 2011: ROTATOR CUFF REPAIR; Right 04/25/2023: VAGINAL HYSTERECTOMY; Bilateral     Comment:  Procedure: HYSTERECTOMY VAGINAL/ BILATERAL SALPINGO               OOPHORECTOMY;  Surgeon: Schermerhorn, Ihor Austin, MD;                Location: [facility] ORS;  Service: Gynecology;  Laterality:               Bilateral;     Reproductive/Obstetrics negative OB ROS                             Anesthesia Physical Anesthesia Plan  ASA: 3  Anesthesia Plan: General   Post-op Pain Management:    Induction: Intravenous  PONV Risk Score and Plan: Propofol infusion and TIVA  Airway Management Planned: Natural Airway and Nasal Cannula  Additional Equipment:   Intra-op Plan:   Post-operative Plan:  Informed Consent: I have reviewed the patients History and Physical, chart, labs and discussed the procedure including the risks, benefits and alternatives for the proposed anesthesia with the patient or authorized representative who has indicated his/her understanding and acceptance.     Dental Advisory Given  Plan Discussed with: Anesthesiologist, CRNA and Surgeon  Anesthesia Plan Comments: (Patient consented for risks of anesthesia including but not limited to:  - adverse reactions to medications - risk of airway placement if required - damage to eyes, teeth, lips or other oral mucosa - nerve damage due to positioning  - sore throat or hoarseness - Damage to heart, brain, nerves, lungs, other parts of body or loss of life  Patient voiced understanding and assent.)       Anesthesia Quick Evaluation

## 2024-02-14 NOTE — Interval H&P Note (Signed)
 History and Physical Interval Note: Preprocedure H&P from 02/14/24  was reviewed and there was no interval change after seeing and examining the patient.  Written consent was obtained from the patient after discussion of risks, benefits, and alternatives. Patient has consented to proceed with Esophagogastroduodenoscopy and Colonoscopy with possible intervention   02/14/2024 2:30 PM  Colleen Murillo  has presented today for surgery, with the diagnosis of R13.10 (ICD-10-CM) - Dysphagia, unspecified type Z86.0100 (ICD-10-CM) - Personal history of colonic polyps K21.9 (ICD-10-CM) - Gastroesophageal reflux disease, unspecified whether esophagitis present.  The various methods of treatment have been discussed with the patient and family. After consideration of risks, benefits and other options for treatment, the patient has consented to  Procedure(s): COLONOSCOPY WITH PROPOFOL (N/A) ESOPHAGOGASTRODUODENOSCOPY (EGD) WITH PROPOFOL (N/A) as a surgical intervention.  The patient's history has been reviewed, patient examined, no change in status, stable for surgery.  I have reviewed the patient's chart and labs.  Questions were answered to the patient's satisfaction.     Colleen Murillo

## 2024-02-14 NOTE — Op Note (Signed)
 [facility] Gastroenterology Patient Name: Colleen Murillo Procedure Date: 02/14/2024 2:23 PM MRN: [id number] Account #: [id number] Date of Birth: 1956/11/05 Admit Type: Outpatient Age: 68 Room: [facility] ENDO ROOM 1 Gender: Female Note Status: Finalized Instrument Name: Upper Endoscope [id number] Procedure:             Upper GI endoscopy Indications:           Dysphagia Providers:             Trenda Moots, DO Referring MD:          Jaynie Collins DO, DO (Referring MD), Kenney Houseman, MD (Referring MD) Medicines:             Monitored Anesthesia Care Complications:         No immediate complications. Estimated blood loss:                         Minimal. Procedure:             Pre-Anesthesia Assessment:                        - Prior to the procedure, a History and Physical was                         performed, and patient medications and allergies were                         reviewed. The patient is competent. The risks and                         benefits of the procedure and the sedation options and                         risks were discussed with the patient. All questions                         were answered and informed consent was obtained.                         Patient identification and proposed procedure were                         verified by the physician, the nurse, the anesthetist                         and the technician in the endoscopy suite. Mental                         Status Examination: alert and oriented. Airway                         Examination: normal oropharyngeal airway and neck                         mobility. Respiratory Examination: clear to  auscultation. CV Examination: RRR, no murmurs, no S3                         or S4. Prophylactic Antibiotics: The patient does not                         require prophylactic antibiotics. Prior                          Anticoagulants: The patient has taken no anticoagulant                         or antiplatelet agents. ASA Grade Assessment: III - A                         patient with severe systemic disease. After reviewing                         the risks and benefits, the patient was deemed in                         satisfactory condition to undergo the procedure. The                         anesthesia plan was to use monitored anesthesia care                         (MAC). Immediately prior to administration of                         medications, the patient was re-assessed for adequacy                         to receive sedatives. The heart rate, respiratory                         rate, oxygen saturations, blood pressure, adequacy of                         pulmonary ventilation, and response to care were                         monitored throughout the procedure. The physical                         status of the patient was re-assessed after the                         procedure.                        After obtaining informed consent, the endoscope was                         passed under direct vision. Throughout the procedure,                         the patient's blood pressure, pulse, and oxygen  saturations were monitored continuously. The Endoscope                         was introduced through the mouth, and advanced to the                         second part of duodenum. The upper GI endoscopy was                         accomplished without difficulty. The patient tolerated                         the procedure well. Findings:      The duodenal bulb, first portion of the duodenum and second portion of       the duodenum were normal. Estimated blood loss: none.      Localized mild inflammation characterized by erythema was found in the       gastric antrum. Biopsies were taken with a cold forceps for Helicobacter       pylori testing. Estimated blood loss was  minimal.      A small hiatal hernia was present. Estimated blood loss: none.      The exam of the stomach was otherwise normal.      Esophagogastric landmarks were identified: the gastroesophageal junction       was found at 35 cm from the incisors.      The Z-line was regular. Estimated blood loss: none.      Abnormal motility was noted in the esophagus. The cricopharyngeus was       normal. There is spasticity of the esophageal body. The distal       esophagus/lower esophageal sphincter is open.      The exam of the esophagus was otherwise normal. Impression:            - Normal duodenal bulb, first portion of the duodenum                         and second portion of the duodenum.                        - Gastritis. Biopsied.                        - Small hiatal hernia.                        - Esophagogastric landmarks identified.                        - Z-line regular.                        - Abnormal esophageal motility, suspicious for                         esophageal spasm. Recommendation:        - Patient has a contact number available for                         emergencies. The signs and symptoms of potential  delayed complications were discussed with the patient.                         Return to normal activities tomorrow. Written                         discharge instructions were provided to the patient.                        - Discharge patient to home.                        - Resume previous diet.                        - Continue present medications.                        - Await pathology results.                        - Return to GI clinic as previously scheduled.                        - consider modified barium swallow study. Can also                         consider motility and manometry study if MBSS negative.                        - proceed with colonoscopy. see report for further                         recommendations.                         - The findings and recommendations were discussed with                         the patient.                        - The findings and recommendations were discussed with                         the patient's family. Procedure Code(s):     --- Professional ---                        [phone], Esophagogastroduodenoscopy, flexible,                         transoral; with biopsy, single or multiple Diagnosis Code(s):     --- Professional ---                        K29.70, Gastritis, unspecified, without bleeding                        K44.9, Diaphragmatic hernia without obstruction or                         gangrene  K22.4, Dyskinesia of esophagus                        R13.10, Dysphagia, unspecified CPT copyright 2022 American Medical Association. All rights reserved. The codes documented in this report are preliminary and upon coder review may  be revised to meet current compliance requirements. Attending Participation:      I personally performed the entire procedure. Elfredia Nevins, DO Jaynie Collins DO, DO 02/14/2024 2:50:36 PM This report has been signed electronically. Number of Addenda: 0 Note Initiated On: 02/14/2024 2:23 PM Estimated Blood Loss:  Estimated blood loss was minimal.      [facility]

## 2024-02-15 NOTE — Anesthesia Postprocedure Evaluation (Signed)
 Anesthesia Post Note  Patient: Yoshie Kosel  Procedure(s) Performed: COLONOSCOPY WITH PROPOFOL ESOPHAGOGASTRODUODENOSCOPY (EGD) WITH PROPOFOL POLYPECTOMY  Patient location during evaluation: PACU Anesthesia Type: General Level of consciousness: awake and alert Pain management: pain level controlled Vital Signs Assessment: post-procedure vital signs reviewed and stable Respiratory status: spontaneous breathing, nonlabored ventilation and respiratory function stable Cardiovascular status: blood pressure returned to baseline and stable Postop Assessment: no apparent nausea or vomiting Anesthetic complications: no   No notable events documented.   Last Vitals:  Vitals:   02/14/24 1519 02/14/24 1530  BP: 107/73 109/72  Pulse: 71 67  Resp: (!) 29 15  Temp:    SpO2: 93% 100%    Last Pain:  Vitals:   02/15/24 0749  TempSrc:   PainSc: 0-No pain                 Foye Deer

## 2024-02-18 LAB — SURGICAL PATHOLOGY

## 2024-04-03 ENCOUNTER — Ambulatory Visit
Admission: RE | Admit: 2024-04-03 | Discharge: 2024-04-03 | Disposition: A | Discharge status: Home / Self Care | Source: Ambulatory Visit | Attending: Acute Care | Admitting: Acute Care

## 2024-04-03 DIAGNOSIS — F17210 Nicotine dependence, cigarettes, uncomplicated: Secondary | ICD-10-CM | POA: Diagnosis present

## 2024-04-03 DIAGNOSIS — Z87891 Personal history of nicotine dependence: Secondary | ICD-10-CM | POA: Insufficient documentation

## 2024-04-03 DIAGNOSIS — Z122 Encounter for screening for malignant neoplasm of respiratory organs: Secondary | ICD-10-CM | POA: Insufficient documentation

## 2024-04-28 ENCOUNTER — Other Ambulatory Visit: Payer: Self-pay

## 2024-04-28 DIAGNOSIS — Z122 Encounter for screening for malignant neoplasm of respiratory organs: Secondary | ICD-10-CM

## 2024-04-28 DIAGNOSIS — Z87891 Personal history of nicotine dependence: Secondary | ICD-10-CM

## 2024-04-28 DIAGNOSIS — F17210 Nicotine dependence, cigarettes, uncomplicated: Secondary | ICD-10-CM
